# Patient Record
Sex: Female | Born: 1975 | Race: White | Hispanic: No | Smoking: Never smoker
Health system: Southern US, Community
[De-identification: ages and names within clinical notes are randomized; demographics above are authoritative.]

## PROBLEM LIST (undated history)

## (undated) DIAGNOSIS — E282 Polycystic ovarian syndrome: Secondary | ICD-10-CM

## (undated) DIAGNOSIS — R55 Syncope and collapse: Secondary | ICD-10-CM

## (undated) DIAGNOSIS — G2581 Restless legs syndrome: Secondary | ICD-10-CM

## (undated) HISTORY — DX: Polycystic ovarian syndrome: E28.2

## (undated) HISTORY — DX: Syncope and collapse: R55

## (undated) HISTORY — PX: ANKLE SURGERY: SHX546

## (undated) HISTORY — PX: DENTAL SURGERY: SHX609

---

## 2000-02-26 ENCOUNTER — Other Ambulatory Visit: Admission: RE | Admit: 2000-02-26 | Discharge: 2000-02-26 | Payer: Self-pay | Admitting: *Deleted

## 2002-04-30 ENCOUNTER — Emergency Department (HOSPITAL_COMMUNITY): Admission: EM | Admit: 2002-04-30 | Discharge: 2002-04-30 | Payer: Self-pay | Admitting: Emergency Medicine

## 2009-01-30 LAB — CONVERTED CEMR LAB: Pap Smear: NORMAL

## 2009-07-27 ENCOUNTER — Encounter (INDEPENDENT_AMBULATORY_CARE_PROVIDER_SITE_OTHER): Payer: Self-pay | Admitting: *Deleted

## 2009-08-02 ENCOUNTER — Telehealth: Payer: Self-pay | Admitting: Family Medicine

## 2009-08-10 ENCOUNTER — Ambulatory Visit: Payer: Self-pay | Admitting: Family Medicine

## 2009-08-10 DIAGNOSIS — M1990 Unspecified osteoarthritis, unspecified site: Secondary | ICD-10-CM | POA: Insufficient documentation

## 2009-08-10 DIAGNOSIS — M25569 Pain in unspecified knee: Secondary | ICD-10-CM

## 2009-08-16 ENCOUNTER — Ambulatory Visit: Payer: Self-pay | Admitting: Family Medicine

## 2009-08-16 ENCOUNTER — Encounter (INDEPENDENT_AMBULATORY_CARE_PROVIDER_SITE_OTHER): Payer: Self-pay | Admitting: *Deleted

## 2009-08-16 LAB — CONVERTED CEMR LAB
Ketones, urine, test strip: NEGATIVE
Nitrite: NEGATIVE
Urobilinogen, UA: 0.2
WBC Urine, dipstick: NEGATIVE

## 2009-08-20 LAB — CONVERTED CEMR LAB
ALT: 15 units/L (ref 0–35)
Alkaline Phosphatase: 62 units/L (ref 39–117)
Basophils Absolute: 0 10*3/uL (ref 0.0–0.1)
Basophils Relative: 0.7 % (ref 0.0–3.0)
Bilirubin, Direct: 0 mg/dL (ref 0.0–0.3)
CO2: 27 meq/L (ref 19–32)
Chloride: 106 meq/L (ref 96–112)
Eosinophils Absolute: 0.2 10*3/uL (ref 0.0–0.7)
MCHC: 33.2 g/dL (ref 30.0–36.0)
MCV: 88 fL (ref 78.0–100.0)
Monocytes Absolute: 0.4 10*3/uL (ref 0.1–1.0)
Neutro Abs: 3.8 10*3/uL (ref 1.4–7.7)
Neutrophils Relative %: 58.8 % (ref 43.0–77.0)
RBC: 4.59 M/uL (ref 3.87–5.11)
RDW: 12.8 % (ref 11.5–14.6)
Sodium: 139 meq/L (ref 135–145)
Total CHOL/HDL Ratio: 2
Total Protein: 7.7 g/dL (ref 6.0–8.3)
Triglycerides: 59 mg/dL (ref 0.0–149.0)

## 2009-09-24 ENCOUNTER — Ambulatory Visit: Payer: Self-pay | Admitting: Family

## 2009-09-24 DIAGNOSIS — R1013 Epigastric pain: Secondary | ICD-10-CM

## 2009-09-26 ENCOUNTER — Encounter: Payer: Self-pay | Admitting: Family

## 2009-09-26 ENCOUNTER — Telehealth (INDEPENDENT_AMBULATORY_CARE_PROVIDER_SITE_OTHER): Payer: Self-pay | Admitting: *Deleted

## 2009-09-26 LAB — CONVERTED CEMR LAB
ALT: 18 units/L (ref 0–35)
AST: 19 units/L (ref 0–37)
Alkaline Phosphatase: 67 units/L (ref 39–117)
Indirect Bilirubin: 0.2 mg/dL (ref 0.0–0.9)
Total Protein: 7.7 g/dL (ref 6.0–8.3)

## 2009-09-27 ENCOUNTER — Encounter: Payer: Self-pay | Admitting: Family

## 2009-10-12 ENCOUNTER — Encounter: Payer: Self-pay | Admitting: Family Medicine

## 2010-08-20 NOTE — Letter (Signed)
Summary: Records Dated 06-21-01 thru 05-24-09/[facility]  Records Dated 06-21-01 thru 05-24-09/[facility]   Imported By: Lanelle Bal 08/22/2009 12:22:50  _____________________________________________________________________  External Attachment:    Type:   Image     Comment:   External Document

## 2010-08-20 NOTE — Letter (Signed)
Summary: Patient Does Not Want Appt/[facility]  Patient Does Not Want Appt/[facility]   Imported By: Lanelle Bal 10/17/2009 12:26:49  _____________________________________________________________________  External Attachment:    Type:   Image     Comment:   External Document

## 2010-08-20 NOTE — Progress Notes (Signed)
Summary: Rx for Vaccination   Phone Note Call from Patient Call back at [phone]   Caller: Patient Summary of Call: Pt has an appt on Jan 21st, she is going out of the [place] and needs vaccinations. She went to the health dept to get some. They told her they would give her the oral dose of the Typhoid but would need an rx for this. She will need to get this done before she comes in. Is this something you will do? Please advise. Army Fossa CMA  August 02, 2009 8:40 AM  Follow-up for Phone Call        Vivotif 1 cap every other day x4 doses --- #4 --- take >1 week before exposure-----repeat in 5 years Follow-up by: Loreen Freud DO,  August 02, 2009 9:54 AM  Additional Follow-up for Phone Call Additional follow up Details #1::        pt is aware- sent to pharm. Army Fossa CMA  August 02, 2009 10:11 AM    New/Updated Medications: VIVOTIF BERNA VACCINE  CPDR (TYPHOID VACCINE) 1 by mouth every other day x 4 doses. Take >1 week before exposure. Repeat in 5 years. Prescriptions: VIVOTIF BERNA VACCINE  CPDR (TYPHOID VACCINE) 1 by mouth every other day x 4 doses. Take >1 week before exposure. Repeat in 5 years.  #4 x 0   Entered by:   Army Fossa CMA   Authorized by:   Loreen Freud DO   Signed by:   Army Fossa CMA on 08/02/2009   Method used:   Electronically to        [facility] (retail)       [address]       [place], [place]  [address]       Ph: [phone]       Fax: [phone]   RxID:   [id number]

## 2010-08-20 NOTE — Letter (Signed)
Summary: New Patient Letter  [facility] [place]  [address] [place], [place] [address]   Phone: [phone]  Fax: [phone]       07/27/2009 MRN: [id number]  HAZELENE DOTEN [address] [place], [place]  [address]  Dear Ms. Ilean Skill,   Welcome to [facility] and thank you for choosing [place] as your Primary Care Providers. Enclosed you will find information about our practice that we hope you find helpful. We have also enclosed forms to be filled out prior to your visit. This will provide [place] with the necessary information and facilitate your being seen in a timely manner. If you have any questions, please call us at: [phone] and we will be happy to assist you. We look forward to seeing you at your scheduled appointment time.  Appointment: Friday, August 10, 2009 at 10:00am   With: Dr. Loreen Freud  (NOTHING TO EAT OR DRINK AFTER MIDNIGHT ON 08-09-2009)     Sincerely,  Primary Health Care Team  Please arrive 15 minutes early for your first appointment and bring your insurance card. Co-pay is required at the time of your visit.  *****Please call the office if you are not able to keep this appointment. There is a charge of $50.00 if any appointment is not cancelled or rescheduled within 24 hours*****

## 2010-08-20 NOTE — Assessment & Plan Note (Signed)
Summary: NEW TO EST,WANTS CPX,UHC INS/RH......   Vital Signs:  Patient profile:   35 year old female Height:      71.5 inches Weight:      305 pounds BMI:     42.10 Temp:     98.2 degrees F oral Pulse rate:   82 / minute Pulse rhythm:   regular BP sitting:   124 / 84  (left arm) Cuff size:   large  Vitals Entered By: Army Fossa CMA (August 10, 2009 10:08 AM) CC: new to establish- needs vaccines (traveling on trip) hep a/b and boostrix.   History of Present Illness: Pt here to establish and get vaccines for [place].  Pt is leaving in 2 weeks.     Pt c/o R knee pain ----  Pt states pain started after standing on it for 15 hours and walking the next day.  Pt saw ortho in [place] but was not happy there.  Pain is getting worse.    Pt sees Dr Edward Jolly for gyn.  Preventive Screening-Counseling & Management  Alcohol-Tobacco     Alcohol drinks/day: <1     Smoking Status: never  Caffeine-Diet-Exercise     Caffeine use/day: 1     Does Patient Exercise: yes     Type of exercise: walking      Exercise (avg: min/session): 30-60     Times/week: <3  Hep-HIV-STD-Contraception     Dental Visit-last 6 months yes     Dental Care Counseling: not indicated; dental care within six months      Sexual History:  single --not sexually active.    Current Medications (verified): 1)  Vivotif Berna Vaccine  Cpdr (Typhoid Vaccine) .Marland Kitchen.. 1 By Mouth Every Other Day X 4 Doses. Take >1 Week Before Exposure. Repeat in 5 Years. 2)  Nabumetone 750 Mg Tabs (Nabumetone) .Marland Kitchen.. 1 By Mouth Two Times A Day.  Allergies (verified): 1)  ! Augmentin  Past History:  Family History: Last updated: 08/10/2009 Family History of Arthritis Family History Diabetes 1st degree relative Family History of Stroke F 1st degree relative <60  Social History: Last updated: 08/10/2009 Occupation:  Risk analyst, photographer Single Never Smoked Alcohol use-yes Regular exercise-yes  Risk Factors: Alcohol Use: <1  (08/10/2009) Caffeine Use: 1 (08/10/2009) Exercise: yes (08/10/2009)  Risk Factors: Smoking Status: never (08/10/2009)  Past Medical History: Osteoarthritis-- R Knee  Past Surgical History: Ankle Repair 1998  Family History: Reviewed history and no changes required. Family History of Arthritis Family History Diabetes 1st degree relative Family History of Stroke F 1st degree relative <60  Social History: Reviewed history and no changes required. Occupation:  Risk analyst, Environmental manager Single Never Smoked Alcohol use-yes Regular exercise-yes Occupation:  employed Smoking Status:  never Does Patient Exercise:  yes Caffeine use/day:  1 Dental Care w/in 6 mos.:  yes Sexual History:  single --not sexually active  Review of Systems      See HPI General:  Denies chills, fatigue, fever, loss of appetite, malaise, sleep disorder, sweats, weakness, and weight loss. Eyes:  Denies blurring, discharge, double vision, eye irritation, eye pain, halos, itching, light sensitivity, red eye, vision loss-1 eye, and vision loss-both eyes; optho--q2y. ENT:  Denies decreased hearing, difficulty swallowing, ear discharge, earache, hoarseness, nasal congestion, nosebleeds, postnasal drainage, ringing in ears, sinus pressure, and sore throat. CV:  Denies bluish discoloration of lips or nails, chest pain or discomfort, difficulty breathing at night, difficulty breathing while lying down, fainting, fatigue, leg cramps with exertion, lightheadness, near fainting, palpitations,  shortness of breath with exertion, swelling of feet, swelling of hands, and weight gain. Resp:  Denies chest discomfort, chest pain with inspiration, cough, coughing up blood, excessive snoring, hypersomnolence, morning headaches, pleuritic, shortness of breath, sputum productive, and wheezing. GI:  Denies abdominal pain, bloody stools, change in bowel habits, constipation, dark tarry stools, diarrhea, excessive appetite, gas,  hemorrhoids, indigestion, loss of appetite, and nausea. GU:  Denies abnormal vaginal bleeding, decreased libido, discharge, dysuria, genital sores, hematuria, incontinence, nocturia, urinary frequency, and urinary hesitancy. MS:  Complains of joint pain; denies joint redness, joint swelling, loss of strength, low back pain, mid back pain, muscle aches, muscle , cramps, muscle weakness, stiffness, and thoracic pain; R knee pain. Derm:  Denies changes in color of skin, changes in nail beds, dryness, excessive perspiration, flushing, hair loss, insect bite(s), itching, lesion(s), poor wound healing, and rash. Neuro:  Denies brief paralysis, difficulty with concentration, disturbances in coordination, falling down, headaches, inability to speak, memory loss, numbness, poor balance, seizures, sensation of room spinning, tingling, tremors, visual disturbances, and weakness. Psych:  Denies alternate hallucination ( auditory/visual), anxiety, depression, easily angered, easily tearful, irritability, mental problems, panic attacks, sense of great danger, suicidal thoughts/plans, thoughts of violence, unusual visions or sounds, and thoughts /plans of harming others. Endo:  Denies cold intolerance, excessive hunger, excessive thirst, excessive urination, heat intolerance, polyuria, and weight change. Heme:  Denies abnormal bruising, bleeding, enlarge lymph nodes, fevers, pallor, and skin discoloration.  Physical Exam  General:  Well-developed,well-nourished,in no acute distress; alert,appropriate and cooperative throughout examination Head:  Normocephalic and atraumatic without obvious abnormalities. No apparent alopecia or balding. Eyes:  pupils equal, pupils round, pupils reactive to light, and no injection.   Ears:  External ear exam shows no significant lesions or deformities.  Otoscopic examination reveals clear canals, tympanic membranes are intact bilaterally without bulging, retraction, inflammation or  discharge. Hearing is grossly normal bilaterally. Nose:  External nasal examination shows no deformity or inflammation. Nasal mucosa are pink and moist without lesions or exudates. Mouth:  Oral mucosa and oropharynx without lesions or exudates.  Teeth in good repair. Neck:  No deformities, masses, or tenderness noted. Breasts:  gyn Lungs:  Normal respiratory effort, chest expands symmetrically. Lungs are clear to auscultation, no crackles or wheezes. Heart:  Normal rate and regular rhythm. S1 and S2 normal without gallop, murmur, click, rub or other extra sounds. Abdomen:  Bowel sounds positive,abdomen soft and non-tender without masses, organomegaly or hernias noted. Genitalia:  gyn Msk:  R knee pain with weight bearing  no pain with palpationno joint warmth and no redness over joints.   Pulses:  R posterior tibial normal, R dorsalis pedis normal, R carotid normal, L posterior tibial normal, L dorsalis pedis normal, and L carotid normal.   Extremities:  No clubbing, cyanosis, edema, or deformity noted with normal full range of motion of all joints.   Neurologic:  No cranial nerve deficits noted. Station and gait are normal. Plantar reflexes are down-going bilaterally. DTRs are symmetrical throughout. Sensory, motor and coordinative functions appear intact. Skin:  small dry patch on abd no other lesions Cervical Nodes:  No lymphadenopathy noted Psych:  Cognition and judgment appear intact. Alert and cooperative with normal attention span and concentration. No apparent delusions, illusions, hallucinations   Impression & Recommendations:  Problem # 1:  PREVENTIVE HEALTH CARE (ICD-V70.0) check fasting labs pap per gyn  ghm utd  Problem # 2:  KNEE PAIN, RIGHT (ICD-719.46)  Her updated medication list for this problem  includes:    Nabumetone 750 Mg Tabs (Nabumetone) .Marland Kitchen... 1 by mouth two times a day.  Orders: Orthopedic Surgeon Referral (Ortho Surgeon)  Discussed strengthening exercises,  use of ice or heat, and medications.   Problem # 3:  OSTEOARTHRITIS (ICD-715.90)  Her updated medication list for this problem includes:    Nabumetone 750 Mg Tabs (Nabumetone) .Marland Kitchen... 1 by mouth two times a day.  Orders: Orthopedic Surgeon Referral (Ortho Surgeon)  Discussed use of medications, application of heat or cold, and exercises.   Complete Medication List: 1)  Vivotif Berna Vaccine Cpdr (Typhoid vaccine) .Marland Kitchen.. 1 by mouth every other day x 4 doses. take >1 week before exposure. repeat in 5 years. 2)  Nabumetone 750 Mg Tabs (Nabumetone) .Marland Kitchen.. 1 by mouth two times a day.  Other Orders: TwinRix 1ml ( Hep A&B Adult dose) (13086) Admin 1st Vaccine (57846) Tdap => 22yrs IM (96295) Admin of Any Addtl Vaccine (28413)  Patient Instructions: 1)  V70   cbcd, lipid , bmp, hep, tsh,  UA---fasting labs   Immunizations Administered:  TwinRix # 1:    Vaccine Type: TwinRix    Site: left deltoid    Mfr: GlaxoSmithKline    Dose: 0.1 ml    Route: IM    Given by: Army Fossa CMA    Exp. Date: 06/21/2010    Lot #: [id number]  Tetanus Vaccine:    Vaccine Type: Tdap    Site: right deltoid    Mfr: GlaxoSmithKline    Dose: 0.5 ml    Route: IM    Given by: Army Fossa CMA    Exp. Date: 09/15/2011    Lot #: [id number]   Flu Vaccine Next Due:  Refused PAP Result Date:  01/30/2009 PAP Result:  normal PAP Next Due:  1 yr   Immunizations Administered:  TwinRix # 1:    Vaccine Type: TwinRix    Site: left deltoid    Mfr: GlaxoSmithKline    Dose: 0.1 ml    Route: IM    Given by: Army Fossa CMA    Exp. Date: 06/21/2010    Lot #: [id number]  Tetanus Vaccine:    Vaccine Type: Tdap    Site: right deltoid    Mfr: GlaxoSmithKline    Dose: 0.5 ml    Route: IM    Given by: Army Fossa CMA    Exp. Date: 09/15/2011    Lot #: [id number]

## 2010-08-20 NOTE — Letter (Signed)
   [place] [facility] [address] [place], [place]  [address]  [place] Phone: [phone]      September 27, 2009   [place] [address] [place], [place] [address]  RE:  LAB RESULTS  Dear  Ms. BOWRING,  The following is an interpretation of your most recent lab tests.  Please take note of any instructions provided or changes to medications that have resulted from your lab work.  KIDNEY FUNCTION TESTS:  Good - no changes needed  LIVER FUNCTION TESTS:  Good - no changes needed   Pancreatic enzymes are normal.  Hope you are feeling better!   Sincerely Yours,    Lemont Fillers FNP

## 2010-08-20 NOTE — Letter (Signed)
Summary: Generic Letter  [place] [facility] [place]  [address] [place], [place] [address]   Phone: [phone]  Fax: [phone]    08/16/2009  Czarina Wentzell [address] [place], [place]  [address]  To Whom It May Concern:  The above patient does have a knee injury and is unable to work out at this time. Please let her out of her current contract.            Sincerely,   Loreen Freud, DO

## 2010-08-20 NOTE — Progress Notes (Signed)
----   Converted from flag ---- ---- 09/26/2009 11:54 AM, Lemont Fillers FNP wrote: Yes pls add  ---- 09/26/2009 11:27 AM, Mervin Kung CMA wrote: Per Jamesetta So at [facility]. The Hepatic panel was marked off on the printed order but they can still add it if you want them to?  ---- 09/25/2009 4:06 PM, Lemont Fillers FNP wrote: could you pls call [facility] and see if hepatic panel was drawn and if not pls have them add on?  This was ordered yesterday.  Thanks ------------------------------       Additional Follow-up for Phone Call Additional follow up Details #2::    09/26/09 Spoke to [place] @ [facility] @ 12pm and added Hepatic function panel--789.06 per Melissa.  Tf,cma Follow-up by: Mervin Kung CMA,  September 26, 2009 12:02 PM

## 2010-08-20 NOTE — Assessment & Plan Note (Signed)
Summary: stomach pains x2 days//just got back from [place]//[facility]   Vital Signs:  Patient profile:   35 year old female Weight:      299.25 pounds BMI:     41.30 Temp:     97.4 degrees F oral Pulse rate:   76 / minute Pulse rhythm:   regular Resp:     16 per minute BP sitting:   120 / 96  (right arm) Cuff size:   large  Vitals Entered By: Mervin Kung CMA (September 24, 2009 3:42 PM) CC: room 5  Stomach pains x 1 1/2 weeks, now having nausea. Comments Pt has recently returned from trip to [place].   Primary Care Provider:  Laury Rodriguez  CC:  room 5  Stomach pains x 1 1/2 weeks and now having nausea..  History of Present Illness: Misty Rodriguez is a 35 year old female who presents with c/o of abdominal pain.  Patient notes + abdominal pain x 10 days.  Recently returned from a mission trip to [place].  Denies fever.  Abdominal pain is localized to the epigastric area.   + nausea, no vomitting.  Denies diarrhea.  Denies dark or bloody stools.  Pain is intermittent in nature- + pain after eating, but notes occasional pain between meals as well.  Notes no alleviating factors.  Notes that some of her trip mates had nausea and vomitting.  Notes that she took cipro empirically one tablet by mouth daily x 6 days.  Also of note patient has been taking prophylactic doxycycline for malaria prevention.  Notes that she was taking this medicine 2 weeks prior to developing any abdominal pain.    Allergies: 1)  ! Augmentin  Physical Exam  General:  morbidly obese white female, in NAD Lungs:  Normal respiratory effort, chest expands symmetrically. Lungs are clear to auscultation, no crackles or wheezes. Heart:  Normal rate and regular rhythm. S1 and S2 normal without gallop, murmur, click, rub or other extra sounds. Abdomen:  Bowel sounds positive,abdomen soft and non-tender without masses, organomegaly or hernias noted.   Impression & Recommendations:  Problem # 1:  EPIGASTRIC PAIN (ICD-789.06) Assessment  New Will add empiric PPI, will also check LFT's, amylase, lipase to r/o GB disease or pancreatitis.   In addition, will d/c doxy and change patient to once weekly mefloquine to complete the malaria prophylaxis.  It maybe that patient's GI upset is due to doxycycline.  plan f/u in 2 weeks, sooner if symptoms worsen Orders: T-Hepatic Function [id number]) T-Amylase [phone]) T-Lipase ([phone])  Complete Medication List: 1)  Vivotif Berna Vaccine Cpdr (Typhoid vaccine) .Marland Kitchen.. 1 by mouth every other day x 4 doses. take >1 week before exposure. repeat in 5 years. 2)  Nabumetone 750 Mg Tabs (Nabumetone) .Marland Kitchen.. 1 by mouth two times a day. 3)  Mefloquine Hcl 250 Mg Tabs (Mefloquine hcl) .... One tablet by mouth once weekly x 4 weeks 4)  Prilosec 20 Mg Cpdr (Omeprazole) .... One tablet by mouth daily  Patient Instructions: 1)  Please call if worsening abdominal pain, diarrhea, fever, or inability to keep down food,drink/medicines. 2)  Please schedule a follow-up appointment in 2 weeks. Prescriptions: MEFLOQUINE HCL 250 MG TABS (MEFLOQUINE HCL) one tablet by mouth once weekly x 4 weeks  #4 x 0   Entered and Authorized by:   Lemont Fillers FNP   Signed by:   Lemont Fillers FNP on 09/24/2009   Method used:   Electronically to  [facility] [address] [address] (retail)       [address]       [place], [place]  [address]       Ph: [phone]       Fax: [phone]   RxID:   [id number]   Current Allergies (reviewed today): ! AUGMENTIN

## 2011-08-14 ENCOUNTER — Encounter: Payer: Self-pay | Admitting: Family Medicine

## 2011-08-14 ENCOUNTER — Other Ambulatory Visit: Payer: Self-pay | Admitting: Family Medicine

## 2011-08-14 ENCOUNTER — Ambulatory Visit (INDEPENDENT_AMBULATORY_CARE_PROVIDER_SITE_OTHER): Payer: 59 | Admitting: Family Medicine

## 2011-08-14 VITALS — BP 122/85 | HR 76 | Temp 98.9°F | Ht 71.0 in | Wt 300.8 lb

## 2011-08-14 DIAGNOSIS — J029 Acute pharyngitis, unspecified: Secondary | ICD-10-CM

## 2011-08-14 DIAGNOSIS — R05 Cough: Secondary | ICD-10-CM

## 2011-08-14 LAB — POCT RAPID STREP A (OFFICE): Rapid Strep A Screen: NEGATIVE

## 2011-08-14 MED ORDER — HYDROCODONE-HOMATROPINE 5-1.5 MG/5ML PO SYRP: 5.0000 mL | ORAL_SOLUTION | Freq: Three times a day (TID) | ORAL | Status: AC | PRN

## 2011-08-14 MED ORDER — CLARITHROMYCIN ER 500 MG PO TB24: 1000.0000 mg | ORAL_TABLET | Freq: Every day | ORAL | Status: AC

## 2011-08-14 NOTE — Progress Notes (Signed)
  Subjective:     Misty Rodriguez is a 36 y.o. female who presents for evaluation of sore throat. Associated symptoms include nasal blockage, post nasal drip, sinus and nasal congestion and sore throat. Onset of symptoms was 2 days ago, and have been gradually worsening since that time. She is drinking plenty of fluids. She has had a recent close exposure to someone with proven streptococcal pharyngitis.  The following portions of the patient's history were reviewed and updated as appropriate: allergies, current medications, past family history, past medical history, past social history, past surgical history and problem list.  Review of Systems Pertinent items are noted in HPI.    Objective:    BP 122/85  Pulse 76  Temp(Src) 98.9 F (37.2 C) (Oral)  Ht 5\' 11"  (1.803 m)  Wt 300 lb 12.8 oz (136.442 kg)  BMI 41.95 kg/m2  SpO2 97%  LMP 08/07/2011 General appearance: alert, cooperative, appears stated age and no distress Ears: normal TM's and external ear canals both ears Nose: green discharge, mild congestion, turbinates red, swollen, edematous, sinus tenderness bilateral Throat: abnormal findings: marked oropharyngeal erythema and tonsillar hypertrophy 2+ Neck: mild anterior cervical adenopathy, supple, symmetrical, trachea midline and thyroid not enlarged, symmetric, no tenderness/mass/nodules Lungs: clear to auscultation bilaterally Heart: S1, S2 normal  Laboratory Strep test done. Results:negative.    Assessment:    Acute pharyngitis, likely  Sinusitis with post nasal drip.    Plan:    Patient placed on antibiotics. Use of OTC analgesics recommended as well as salt water gargles. Follow up as needed.

## 2011-08-14 NOTE — Patient Instructions (Signed)

## 2011-08-16 LAB — CULTURE, GROUP A STREP

## 2011-08-25 ENCOUNTER — Telehealth: Payer: Self-pay | Admitting: *Deleted

## 2011-08-25 NOTE — Telephone Encounter (Signed)
Its been more than 2 weeks---pt needs ov

## 2011-08-25 NOTE — Telephone Encounter (Signed)
Pt states that she took antibiotic and sore throat resolve than 4 days after completing med sore throat return. Pt denies any fever,or difficulty with swallowing. Pt now c/o sore throat again. Pt  throat culture was normal. Please advise   Pt uses [facility], Pt aware Dr Laury Axon out of office today will advise on issue tomorrow.

## 2011-08-26 NOTE — Telephone Encounter (Signed)
Discussed with patient and she stated she wanted to wait a few more days before she scheduled an apt because she felt better today.     KP

## 2011-09-19 ENCOUNTER — Other Ambulatory Visit (HOSPITAL_COMMUNITY)
Admission: RE | Admit: 2011-09-19 | Discharge: 2011-09-19 | Disposition: A | Discharge status: Home / Self Care | Payer: 59 | Source: Ambulatory Visit | Attending: Family Medicine | Admitting: Family Medicine

## 2011-09-19 ENCOUNTER — Ambulatory Visit (INDEPENDENT_AMBULATORY_CARE_PROVIDER_SITE_OTHER): Payer: 59 | Admitting: Family Medicine

## 2011-09-19 ENCOUNTER — Encounter: Payer: Self-pay | Admitting: Family Medicine

## 2011-09-19 VITALS — BP 116/80 | HR 86 | Temp 97.5°F | Ht 71.5 in | Wt 304.2 lb

## 2011-09-19 DIAGNOSIS — Z01419 Encounter for gynecological examination (general) (routine) without abnormal findings: Secondary | ICD-10-CM | POA: Insufficient documentation

## 2011-09-19 DIAGNOSIS — Z0000 Encounter for general adult medical examination without abnormal findings: Secondary | ICD-10-CM

## 2011-09-19 DIAGNOSIS — R319 Hematuria, unspecified: Secondary | ICD-10-CM

## 2011-09-19 DIAGNOSIS — E282 Polycystic ovarian syndrome: Secondary | ICD-10-CM

## 2011-09-19 LAB — CBC WITH DIFFERENTIAL/PLATELET
Basophils Relative: 0.3 % (ref 0.0–3.0)
Eosinophils Absolute: 0.2 10*3/uL (ref 0.0–0.7)
Hemoglobin: 13.9 g/dL (ref 12.0–15.0)
MCHC: 33.9 g/dL (ref 30.0–36.0)
MCV: 85.1 fl (ref 78.0–100.0)
Monocytes Absolute: 0.4 10*3/uL (ref 0.1–1.0)
Neutro Abs: 4.8 10*3/uL (ref 1.4–7.7)
Neutrophils Relative %: 66.1 % (ref 43.0–77.0)
RBC: 4.8 Mil/uL (ref 3.87–5.11)
RDW: 14.1 % (ref 11.5–14.6)

## 2011-09-19 LAB — LIPID PANEL
HDL: 56.7 mg/dL (ref >39.00)
Triglycerides: 72 mg/dL (ref 0.0–149.0)

## 2011-09-19 LAB — POCT URINALYSIS DIPSTICK
Bilirubin, UA: NEGATIVE
Ketones, UA: NEGATIVE
Spec Grav, UA: <=1.005
pH, UA: 6.5

## 2011-09-19 LAB — BASIC METABOLIC PANEL
CO2: 25 mEq/L (ref 19–32)
Chloride: 103 mEq/L (ref 96–112)
Creatinine, Ser: 0.7 mg/dL (ref 0.4–1.2)
Glucose, Bld: 93 mg/dL (ref 70–99)

## 2011-09-19 LAB — T4, FREE: Free T4: 0.93 ng/dL (ref 0.60–1.60)

## 2011-09-19 LAB — ESTRADIOL: Estradiol: 226.8 pg/mL

## 2011-09-19 LAB — HEPATIC FUNCTION PANEL
Albumin: 4 g/dL (ref 3.5–5.2)
Total Protein: 7.6 g/dL (ref 6.0–8.3)

## 2011-09-19 NOTE — Progress Notes (Signed)
Addended by: Silvio Pate D on: 09/19/2011 10:39 AM   Modules accepted: Orders

## 2011-09-19 NOTE — Patient Instructions (Signed)

## 2011-09-19 NOTE — Progress Notes (Signed)
Subjective:     Misty Rodriguez is a 36 y.o. female and is here for a comprehensive physical exam. The patient reports no problems.  History   Social History  . Marital Status: Single    Spouse Name: N/A    Number of Children: N/A  . Years of Education: N/A   Occupational History  . 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 Smoking status: Never Smoker   . Smokeless tobacco: Not on file  . Alcohol Use: No  . Drug Use: No  . Sexually Active: No   Other Topics Concern  . Not on file   Social History Narrative   Exercise--swim, pilates and walk--  Something everyday   Health Maintenance  Topic Date Due  . Influenza Vaccine  04/20/2012  . Pap Smear  09/19/2014  . Tetanus/tdap  08/11/2019    The following portions of the patient's history were reviewed and updated as appropriate: allergies, current medications, past family history, past medical history, past social history, past surgical history and problem list.  Review of Systems Review of Systems  Constitutional: Negative for activity change, appetite change and fatigue.  HENT: Negative for hearing loss, congestion, tinnitus and ear discharge.  dentist q75m Eyes: Negative for visual disturbance (see optho --last time 4 yrs ago-- no problems) Respiratory: Negative for cough, chest tightness and shortness of breath.   Cardiovascular: Negative for chest pain, palpitations and leg swelling.  Gastrointestinal: Negative for abdominal pain, diarrhea, constipation and abdominal distention.  Genitourinary: Negative for urgency, frequency, decreased urine volume and difficulty urinating.  Musculoskeletal: Negative for back pain, arthralgias and gait problem.  Skin: Negative for color change, pallor and rash.  Neurological: Negative for dizziness, light-headedness, numbness and headaches.  Hematological: Negative for adenopathy. Does not bruise/bleed easily.  Psychiatric/Behavioral: Negative for suicidal  ideas, confusion, sleep disturbance, self-injury, dysphoric mood, decreased concentration and agitation.       Objective:    BP 116/80  Pulse 86  Temp(Src) 97.5 F (36.4 C) (Oral)  Ht 5' 11.5" (1.816 m)  Wt 304 lb 3.8 oz (138 kg)  BMI 41.84 kg/m2  SpO2 97%  LMP 09/07/2011 General appearance: alert, cooperative, appears stated age and no distress Head: Normocephalic, without obvious abnormality, atraumatic Eyes: conjunctivae/corneas clear. PERRL, EOM's intact. Fundi benign. Ears: normal TM's and external ear canals both ears Nose: Nares normal. Septum midline. Mucosa normal. No drainage or sinus tenderness. Throat: lips, mucosa, and tongue normal; teeth and gums normal Neck: no adenopathy, supple, symmetrical, trachea midline and thyroid not enlarged, symmetric, no tenderness/mass/nodules Back: symmetric, no curvature. ROM normal. No CVA tenderness. Lungs: clear to auscultation bilaterally Breasts: normal appearance, no masses or tenderness Heart: regular rate and rhythm, S1, S2 normal, no murmur, click, rub or gallop Abdomen: soft, non-tender; bowel sounds normal; no masses,  no organomegaly-- obese Pelvic: cervix normal in appearance, external genitalia normal, no adnexal masses or tenderness, no cervical motion tenderness, rectovaginal septum normal, uterus normal size, shape, and consistency and vagina normal without discharge Extremities: extremities normal, atraumatic, no cyanosis or edema Pulses: 2+ and symmetric Skin: Skin color, texture, turgor normal. No rashes or lesions Lymph nodes: Cervical, supraclavicular, and axillary nodes normal. Neurologic: Alert and oriented X 3, normal strength and tone. Normal symmetric reflexes. Normal coordination and gait    Assessment:    Healthy female exam.    hx PCOS-- -check labs at pt request Plan:    check labs ghm utd See After Visit Summary for  Counseling Recommendations

## 2011-09-22 LAB — TESTOSTERONE, FREE, TOTAL, SHBG
Testosterone, Free: 7.4 pg/mL — ABNORMAL HIGH (ref 0.6–6.8)
Testosterone-% Free: 1.4 % (ref 0.4–2.4)
Testosterone: 52.52 ng/dL (ref 10–70)

## 2011-09-23 LAB — URINE CULTURE

## 2012-09-04 ENCOUNTER — Other Ambulatory Visit: Payer: Self-pay

## 2013-05-26 ENCOUNTER — Other Ambulatory Visit: Payer: Self-pay

## 2013-11-09 ENCOUNTER — Telehealth: Payer: Self-pay

## 2013-11-09 NOTE — Telephone Encounter (Signed)
Left message for call back Non-identifiable   Pap- 09/19/11 Flu Td- 08/10/09

## 2013-11-10 ENCOUNTER — Encounter: Payer: Self-pay | Admitting: Family Medicine

## 2013-11-10 ENCOUNTER — Ambulatory Visit (INDEPENDENT_AMBULATORY_CARE_PROVIDER_SITE_OTHER): Payer: PRIVATE HEALTH INSURANCE | Admitting: Family Medicine

## 2013-11-10 VITALS — BP 114/80 | HR 66 | Temp 98.0°F | Ht 71.5 in | Wt 300.0 lb

## 2013-11-10 DIAGNOSIS — M25562 Pain in left knee: Secondary | ICD-10-CM

## 2013-11-10 DIAGNOSIS — M25561 Pain in right knee: Secondary | ICD-10-CM

## 2013-11-10 DIAGNOSIS — L84 Corns and callosities: Secondary | ICD-10-CM

## 2013-11-10 DIAGNOSIS — M25569 Pain in unspecified knee: Secondary | ICD-10-CM

## 2013-11-10 DIAGNOSIS — Z0000 Encounter for general adult medical examination without abnormal findings: Secondary | ICD-10-CM

## 2013-11-10 DIAGNOSIS — E559 Vitamin D deficiency, unspecified: Secondary | ICD-10-CM

## 2013-11-10 MED ORDER — VITAMIN D (ERGOCALCIFEROL) 1.25 MG (50000 UNIT) PO CAPS: 50000.0000 [IU] | ORAL_CAPSULE | ORAL | Status: DC

## 2013-11-10 NOTE — Progress Notes (Signed)
Subjective:     Misty Rodriguez is a 38 y.o. female and is here for a comprehensive physical exam. The patient reports problems - b/l knee pain --she is requesting an orthopedic referral.  she also has labs from work to review and is requesting a note for massage, rolfing for chronic neck and back issues. .  History   Social History  . Marital Status: Single    Spouse Name: N/A    Number of Children: N/A  . Years of Education: N/A   Occupational History  . 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 Smoking status: Never Smoker   . Smokeless tobacco: Not on file  . Alcohol Use: No  . Drug Use: No  . Sexual Activity: No   Other Topics Concern  . Not on file   Social History Narrative   Exercise--swim, pilates and walk--  Something everyday   Health Maintenance  Topic Date Due  . Influenza Vaccine  02/18/2014  . Pap Smear  09/19/2014  . Tetanus/tdap  08/11/2019    The following portions of the patient's history were reviewed and updated as appropriate:  She  has a past medical history of PCOS (polycystic ovarian syndrome). She  does not have any pertinent problems on file. She  has past surgical history that includes Ankle surgery and Dental surgery. Her family history includes Aneurysm in her father and paternal grandfather; Diabetes in her paternal grandfather; Heart disease in her maternal grandfather; Hypertension in her maternal grandmother; Stroke in her maternal grandmother. She  reports that she has never smoked. She does not have any smokeless tobacco history on file. She reports that she does not drink alcohol or use illicit drugs. She has a current medication list which includes the following prescription(s): vitamin d (ergocalciferol). No current outpatient prescriptions on file prior to visit.   No current facility-administered medications on file prior to visit.   She is allergic to amoxicillin-pot clavulanate..  Review of  Systems Review of Systems  Constitutional: Negative for activity change, appetite change and fatigue.  HENT: Negative for hearing loss, congestion, tinnitus and ear discharge.  dentist q645m Eyes: Negative for visual disturbance (see optho--- due) Respiratory: Negative for cough, chest tightness and shortness of breath.   Cardiovascular: Negative for chest pain, palpitations and leg swelling.  Gastrointestinal: Negative for abdominal pain, diarrhea, constipation and abdominal distention.  Genitourinary: Negative for urgency, frequency, decreased urine volume and difficulty urinating.  Musculoskeletal: Negative for back pain, arthralgias and gait problem.  Skin: Negative for color change, pallor and rash.  Neurological: Negative for dizziness, light-headedness, numbness and headaches.  Hematological: Negative for adenopathy. Does not bruise/bleed easily.  Psychiatric/Behavioral: Negative for suicidal ideas, confusion, sleep disturbance, self-injury, dysphoric mood, decreased concentration and agitation.       Objective:    BP 114/80  Pulse 66  Temp(Src) 98 F (36.7 C) (Oral)  Ht 5' 11.5" (1.816 m)  Wt 300 lb (136.079 kg)  BMI 41.26 kg/m2  SpO2 98%  LMP 11/07/2013 General appearance: alert, cooperative, appears stated age and no distress Head: Normocephalic, without obvious abnormality, atraumatic Eyes: conjunctivae/corneas clear. PERRL, EOM's intact. Fundi benign. Ears: normal TM's and external ear canals both ears Nose: Nares normal. Septum midline. Mucosa normal. No drainage or sinus tenderness. Throat: lips, mucosa, and tongue normal; teeth and gums normal Neck: no adenopathy, no carotid bruit, no JVD, supple, symmetrical, trachea midline and thyroid not enlarged, symmetric, no tenderness/mass/nodules Back: symmetric, no curvature. ROM normal.  No CVA tenderness. Lungs: clear to auscultation bilaterally Breasts: normal appearance, no masses or tenderness Heart: S1, S2  normal Abdomen: soft, non-tender; bowel sounds normal; no masses,  no organomegaly Pelvic: deferred--- pt on period Extremities: extremities normal, atraumatic, no cyanosis or edema--- corns on both feet 5th toe Pulses: 2+ and symmetric Skin: Skin color, texture, turgor normal. No rashes or lesions Lymph nodes: Cervical, supraclavicular, and axillary nodes normal. Neurologic: Alert and oriented X 3, normal strength and tone. Normal symmetric reflexes. Normal coordination and gait Psych--no depression,no anxiety      Assessment:    Healthy female exam.      Plan:    see labs from work ghm utd See After Visit Summary for Counseling Recommendations   1. Knee pain, bilateral  - Ambulatory referral to Orthopedic Surgery  2. Unspecified vitamin D deficiency  - Vitamin D, Ergocalciferol, (DRISDOL) 50000 UNITS CAPS capsule; Take 1 capsule (50,000 Units total) by mouth every 7 (seven) days.  Dispense: 4 capsule; Refill: 11  3. Preventative health care   4. Corns  - Ambulatory referral to Podiatry

## 2013-11-10 NOTE — Patient Instructions (Signed)

## 2013-11-10 NOTE — Progress Notes (Signed)
Pre visit review using our clinic review tool, if applicable. No additional management support is needed unless otherwise documented below in the visit note. 

## 2013-11-14 NOTE — Telephone Encounter (Signed)
Unable to reach pre visit.  

## 2013-11-23 ENCOUNTER — Encounter: Payer: Self-pay | Admitting: Podiatry

## 2013-11-23 ENCOUNTER — Ambulatory Visit (INDEPENDENT_AMBULATORY_CARE_PROVIDER_SITE_OTHER): Payer: PRIVATE HEALTH INSURANCE | Admitting: Podiatry

## 2013-11-23 VITALS — BP 134/94 | HR 60 | Ht 71.5 in | Wt 300.0 lb

## 2013-11-23 DIAGNOSIS — M216X9 Other acquired deformities of unspecified foot: Secondary | ICD-10-CM | POA: Insufficient documentation

## 2013-11-23 DIAGNOSIS — M2040 Other hammer toe(s) (acquired), unspecified foot: Secondary | ICD-10-CM | POA: Insufficient documentation

## 2013-11-23 DIAGNOSIS — M21969 Unspecified acquired deformity of unspecified lower leg: Secondary | ICD-10-CM | POA: Insufficient documentation

## 2013-11-23 NOTE — Patient Instructions (Signed)
Seen for painful corns on 5th bilateral. Noted of contracted digits 5th bilateral, tight Achilles tendon right, short first metatarsal on both feet. Reviewed all available options. May benefit from stretch exercise for right leg. Return to discuss for further treatment options.

## 2013-11-23 NOTE — Progress Notes (Signed)
Subjective: 38 year old female presents complaining of painful corn on 5th digits bilateral. Also callus under left foot mid lateral surface. She trims the corns but they come back soon. She is here to discuss all available treatment options for the painful corns.  She is also suffering from right knee pain.   Review of Systems - General ROS: negative for - chills, fatigue, fever, night sweats, weight loss or Does have problem with sleep, possible due to restless leg.  Ophthalmic ROS: negative ENT ROS: negative Breast ROS: negative for breast lumps Respiratory ROS: no cough, shortness of breath, or wheezing Cardiovascular ROS: no chest pain or dyspnea on exertion Gastrointestinal ROS: no abdominal pain, change in bowel habits, or black or bloody stools Genito-Urinary ROS: no dysuria, trouble voiding, or hematuria Musculoskeletal ROS: negative Neurological ROS: no TIA or stroke symptoms Dermatological ROS: negative.   Objective: Dermatologic: Digital corn 5th digits at DIPJ bilateral. Light broad callus under ball of both feet. Vascular: All pedal pulses are palpable.  Orthopedic: Contracted 5th digit bilateral, short and hypermobile first ray bilateral, increased forefoot varus with STJ hyperpronation.  Tight Achilles tendon on right lower limb.    Radiographic examination reveal short first metatarsal length, right (-3), left (-4).  Positive of elevated first ray bilateral.  Reveal no acute osseous changes, no significant change in rearfoot joint, Subtalar joint or mid foot area.  Noted of severely contracted lesser digits.   Assessment: 1. 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 2. Short first ray with hypermobility bilateral. 3. Forefoot varus bilateral with Subtalar joint hyperpronation. 4. Ankle equinus right.   Plan: Reviewed clinical findings and available treatment options; periodic debridement, custom orthotics, change in shoe gear or activities, and also  surgical options.  Patient wishes to return for further discussion on treatment options.

## 2014-04-20 ENCOUNTER — Encounter: Payer: Self-pay | Admitting: Family Medicine

## 2014-04-20 ENCOUNTER — Ambulatory Visit (INDEPENDENT_AMBULATORY_CARE_PROVIDER_SITE_OTHER): Payer: Commercial Managed Care - PPO | Admitting: Family Medicine

## 2014-04-20 VITALS — BP 116/80 | HR 76 | Temp 98.7°F | Wt 320.1 lb

## 2014-04-20 DIAGNOSIS — G4700 Insomnia, unspecified: Secondary | ICD-10-CM

## 2014-04-20 DIAGNOSIS — R0683 Snoring: Secondary | ICD-10-CM

## 2014-04-20 DIAGNOSIS — G2581 Restless legs syndrome: Secondary | ICD-10-CM

## 2014-04-20 LAB — HEPATIC FUNCTION PANEL
ALT: 20 U/L (ref 0–35)
AST: 20 U/L (ref 0–37)
Albumin: 4 g/dL (ref 3.5–5.2)
Alkaline Phosphatase: 48 U/L (ref 39–117)
BILIRUBIN TOTAL: 0.2 mg/dL (ref 0.2–1.2)
Bilirubin, Direct: 0 mg/dL (ref 0.0–0.3)
Total Protein: 8.1 g/dL (ref 6.0–8.3)

## 2014-04-20 LAB — IBC PANEL
Iron: 69 ug/dL (ref 42–145)
SATURATION RATIOS: 20.1 % (ref 20.0–50.0)
TRANSFERRIN: 244.7 mg/dL (ref 212.0–360.0)

## 2014-04-20 LAB — TSH: TSH: 1.97 u[IU]/mL (ref 0.35–4.50)

## 2014-04-20 LAB — CBC WITH DIFFERENTIAL/PLATELET
BASOS PCT: 0.3 % (ref 0.0–3.0)
Basophils Absolute: 0 10*3/uL (ref 0.0–0.1)
EOS PCT: 4.3 % (ref 0.0–5.0)
Eosinophils Absolute: 0.3 10*3/uL (ref 0.0–0.7)
HCT: 40.1 % (ref 36.0–46.0)
Hemoglobin: 13.8 g/dL (ref 12.0–15.0)
LYMPHS PCT: 27 % (ref 12.0–46.0)
Lymphs Abs: 1.8 10*3/uL (ref 0.7–4.0)
MCHC: 34.4 g/dL (ref 30.0–36.0)
MCV: 82.7 fl (ref 78.0–100.0)
Monocytes Absolute: 0.5 10*3/uL (ref 0.1–1.0)
Monocytes Relative: 8.2 % (ref 3.0–12.0)
NEUTROS PCT: 60.2 % (ref 43.0–77.0)
Neutro Abs: 4 10*3/uL (ref 1.4–7.7)
Platelets: 273 10*3/uL (ref 150.0–400.0)
RBC: 4.85 Mil/uL (ref 3.87–5.11)
RDW: 13.9 % (ref 11.5–15.5)
WBC: 6.7 10*3/uL (ref 4.0–10.5)

## 2014-04-20 LAB — BASIC METABOLIC PANEL
BUN: 11 mg/dL (ref 6–23)
CO2: 24 mEq/L (ref 19–32)
Calcium: 9.1 mg/dL (ref 8.4–10.5)
Chloride: 105 mEq/L (ref 96–112)
Creatinine, Ser: 0.7 mg/dL (ref 0.4–1.2)
GFR: 98.86 mL/min (ref >60.00)
Glucose, Bld: 91 mg/dL (ref 70–99)
Potassium: 4.2 mEq/L (ref 3.5–5.1)
SODIUM: 135 meq/L (ref 135–145)

## 2014-04-20 LAB — T3, FREE: T3 FREE: 3.1 pg/mL (ref 2.3–4.2)

## 2014-04-20 LAB — FERRITIN: FERRITIN: 23.9 ng/mL (ref 10.0–291.0)

## 2014-04-20 LAB — T4, FREE: Free T4: 0.83 ng/dL (ref 0.60–1.60)

## 2014-04-20 MED ORDER — PRAMIPEXOLE DIHYDROCHLORIDE 0.125 MG PO TABS: ORAL_TABLET | ORAL | Status: DC

## 2014-04-20 NOTE — Progress Notes (Signed)
   Subjective:    Patient ID: Misty Rodriguez, female    DOB: 04/14/1976, 38 y.o.   MRN: [id number]  HPI Pt here c/o insomnia for years.  Pt admits to snoring but states she has several episodes of tonsilitis a year.  The ENT told her they would not take out her tonsils because it was viral.  She also feels like she has rls.  Pt states she has never been told she stops breathing.     Review of Systems As above    Objective:   Physical Exam BP 116/80  Pulse 76  Temp(Src) 98.7 F (37.1 C) (Oral)  Wt 320 lb 1.7 oz (145.2 kg)  SpO2 98% General appearance: alert, cooperative, appears stated age and no distress Neck: no adenopathy, supple, symmetrical, trachea midline and thyroid not enlarged, symmetric, no tenderness/mass/nodules Lungs: clear to auscultation bilaterally Heart: S1, S2 normal Extremities: extremities normal, atraumatic, no cyanosis or edema        Assessment & Plan:  1. Insomnia  - Ambulatory referral to Pulmonology  2. Snoring ? Tonsils are contributing-- pt had seen ENT in past but she was told they would not take them out--she has had several episodes of tonsilitis - Ambulatory referseral to Pulmonology---sleep study - TSH - T3, free - T4, free  3. RLS (restless legs syndrome)  - pramipexole (MIRAPEX) 0.125 MG tablet; As directed  Dispense: 60 tablet; Refill: 0 - CBC with Differential - IBC panel - Ferritin - Basic metabolic panel - Hepatic function panel - TSH - T3, free - T4, free

## 2014-04-20 NOTE — Progress Notes (Signed)
Pre visit review using our clinic review tool, if applicable. No additional management support is needed unless otherwise documented below in the visit note. 

## 2014-04-20 NOTE — Patient Instructions (Signed)
Insomnia Insomnia is frequent trouble falling and/or staying asleep. Insomnia can be a long term problem or a short term problem. Both are common. Insomnia can be a short term problem when the wakefulness is related to a certain stress or worry. Long term insomnia is often related to ongoing stress during waking hours and/or poor sleeping habits. Overtime, sleep deprivation itself can make the problem worse. Every little thing feels more severe because you are overtired and your ability to cope is decreased. CAUSES   Stress, anxiety, and depression.  Poor sleeping habits.  Distractions such as TV in the bedroom.  Naps close to bedtime.  Engaging in emotionally charged conversations before bed.  Technical reading before sleep.  Alcohol and other sedatives. They may make the problem worse. They can hurt normal sleep patterns and normal dream activity.  Stimulants such as caffeine for several hours prior to bedtime.  Pain syndromes and shortness of breath can cause insomnia.  Exercise late at night.  Changing time zones may cause sleeping problems (jet lag). It is sometimes helpful to have someone observe your sleeping patterns. They should look for periods of not breathing during the night (sleep apnea). They should also look to see how long those periods last. If you live alone or observers are uncertain, you can also be observed at a sleep clinic where your sleep patterns will be professionally monitored. Sleep apnea requires a checkup and treatment. Give your caregivers your medical history. Give your caregivers observations your family has made about your sleep.  SYMPTOMS   Not feeling rested in the morning.  Anxiety and restlessness at bedtime.  Difficulty falling and staying asleep. TREATMENT   Your caregiver may prescribe treatment for an underlying medical disorders. Your caregiver can give advice or help if you are using alcohol or other drugs for self-medication. Treatment  of underlying problems will usually eliminate insomnia problems.  Medications can be prescribed for short time use. They are generally not recommended for lengthy use.  Over-the-counter sleep medicines are not recommended for lengthy use. They can be habit forming.  You can promote easier sleeping by making lifestyle changes such as:  Using relaxation techniques that help with breathing and reduce muscle tension.  Exercising earlier in the day.  Changing your diet and the time of your last meal. No night time snacks.  Establish a regular time to go to bed.  Counseling can help with stressful problems and worry.  Soothing music and white noise may be helpful if there are background noises you cannot remove.  Stop tedious detailed work at least one hour before bedtime. HOME CARE INSTRUCTIONS   Keep a diary. Inform your caregiver about your progress. This includes any medication side effects. See your caregiver regularly. Take note of:  Times when you are asleep.  Times when you are awake during the night.  The quality of your sleep.  How you feel the next day. This information will help your caregiver care for you.  Get out of bed if you are still awake after 15 minutes. Read or do some quiet activity. Keep the lights down. Wait until you feel sleepy and go back to bed.  Keep regular sleeping and waking hours. Avoid naps.  Exercise regularly.  Avoid distractions at bedtime. Distractions include watching television or engaging in any intense or detailed activity like attempting to balance the household checkbook.  Develop a bedtime ritual. Keep a familiar routine of bathing, brushing your teeth, climbing into bed at the same   time each night, listening to soothing music. Routines increase the success of falling to sleep faster.  Use relaxation techniques. This can be using breathing and muscle tension release routines. It can also include visualizing peaceful scenes. You can  also help control troubling or intruding thoughts by keeping your mind occupied with boring or repetitive thoughts like the old concept of counting sheep. You can make it more creative like imagining planting one beautiful flower after another in your backyard garden.  During your day, work to eliminate stress. When this is not possible use some of the previous suggestions to help reduce the anxiety that accompanies stressful situations. MAKE SURE YOU:   Understand these instructions.  Will watch your condition.  Will get help right away if you are not doing well or get worse. Document Released: 07/04/2000 Document Revised: 09/29/2011 Document Reviewed: 08/04/2007 Exit[employer] Patient Information 2015 [employer]. This information is not intended to replace advice given to you by your health care provider. Make sure you discuss any questions you have with your health care provider.  

## 2014-06-06 ENCOUNTER — Encounter: Payer: Self-pay | Admitting: Pulmonary Disease

## 2014-06-06 ENCOUNTER — Ambulatory Visit (INDEPENDENT_AMBULATORY_CARE_PROVIDER_SITE_OTHER): Payer: Commercial Managed Care - PPO | Admitting: Pulmonary Disease

## 2014-06-06 VITALS — BP 122/82 | HR 80 | Temp 97.1°F | Ht 71.0 in | Wt 321.6 lb

## 2014-06-06 DIAGNOSIS — G4733 Obstructive sleep apnea (adult) (pediatric): Secondary | ICD-10-CM | POA: Insufficient documentation

## 2014-06-06 DIAGNOSIS — G2581 Restless legs syndrome: Secondary | ICD-10-CM

## 2014-06-06 NOTE — Progress Notes (Signed)
Subjective:    Patient ID: Misty LandryRachel Rodriguez, female    DOB: 09/08/1975, 38 y.o.   MRN: [id number]  HPI  Chief Complaint  Patient presents with  . Advice Only    Referred by Dr. 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 She reports non-refreshing sleep. She reports creepy crawly sensations in her legs that started in the late evening and persist after she gets into bed. Her boyfriend has noted loud snoring. She has been bothered somewhat by enlarged tonsils.she reports some degree of temperature sensitivity. Epworth sleepiness score is 5 Bedtime is around 10:30 PM, she watches an episode of Seinfeld before switching off the TV, sleep latency is up to 1 hour, she sleeps on her side with one pillow, reports 3-5 nocturnal awakenings, sometimes due to restless legs, she has to get up and walk around to feel better and then get back into bed, she is out of bed by 7:30 AM feeling tired but denies dryness of mouth or headaches. She's gained 30 pounds over the last 2 years. She tried Mirapex for 2 weeks, but her leg symptoms got worse and hence she  Stopped  There is no history suggestive of cataplexy, sleep paralysis or parasomnias  Labs-iron levels normal, ferritin 24, TSH/free T4 normal  Her dad does have OSA  Past Medical History  Diagnosis Date  . PCOS (polycystic ovarian syndrome)     Past Surgical History  Procedure Laterality Date  . Ankle surgery    . Dental surgery      Allergies  Allergen Reactions  . Amoxicillin-Pot Clavulanate    History   Social History  . Marital Status: Single    Spouse Name: N/A    Number of Children: 0  . Years of Education: N/A   Occupational History  . 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 Smoking status: Never Smoker   . Smokeless tobacco: Never Used  . Alcohol Use: 0.0 oz/week    0 Not specified per week     Comment: Socially only  . Drug Use: No  . Sexual Activity: Yes   Other Topics Concern  . Not on file   Social History Narrative   Exercise--swim, pilates and walk--  Something everyday    Family History  Problem Relation Age of Onset  . Aneurysm Father   . Stroke Maternal Grandmother   . Hypertension Maternal Grandmother   . Heart disease Maternal Grandfather   . Diabetes Paternal Grandfather   . Aneurysm Paternal Grandfather   . Sleep apnea Father      Review of Systems Constitutional: negative for anorexia, fevers and sweats  Eyes: negative for irritation, redness and visual disturbance  Ears, nose, mouth, throat, and face: negative for earaches, epistaxis, nasal congestion and sore throat  Respiratory: negative for cough, dyspnea on exertion, sputum and wheezing  Cardiovascular: negative for chest pain, dyspnea, lower extremity edema, orthopnea, palpitations and syncope  Gastrointestinal: negative for abdominal pain, constipation, diarrhea, melena, nausea and vomiting  Genitourinary:negative for dysuria, frequency and hematuria  Hematologic/lymphatic: negative for bleeding, easy bruising and lymphadenopathy  Musculoskeletal:negative for arthralgias, muscle weakness and stiff joints  Neurological: negative for coordination problems, gait problems, headaches and weakness  Endocrine: negative for diabetic symptoms including polydipsia, polyuria and weight loss     Objective:   Physical Exam  Gen. Pleasant, obese, in no distress, normal affect ENT - no lesions, no post nasal drip, class 2-3 airway Neck: No JVD, no thyromegaly, no carotid bruits Lungs: no use of accessory muscles, no dullness to percussion, decreased without rales or rhonchi  Cardiovascular: Rhythm regular, heart sounds   normal, no murmurs or gallops, no peripheral edema Abdomen: soft and non-tender, no hepatosplenomegaly, BS normal. Musculoskeletal: No deformities, no cyanosis or clubbing Neuro:  alert, non focal, no tremors       Assessment & Plan:

## 2014-06-06 NOTE — Assessment & Plan Note (Signed)
Given excessive daytime fatigue, narrow pharyngeal exam, witnessed apneas & loud snoring, obstructive sleep apnea is very likely & an overnight polysomnogram will be scheduled as a split study. The pathophysiology of obstructive sleep apnea , it's cardiovascular consequences & modes of treatment including CPAP were discused with the patient in detail & they evidenced understanding.

## 2014-06-06 NOTE — Assessment & Plan Note (Signed)
Will undertake trial of requip if no OSA on PSG

## 2014-06-06 NOTE — Patient Instructions (Signed)
Sleep study You will need treatment for restless legs

## 2014-06-09 ENCOUNTER — Encounter (HOSPITAL_BASED_OUTPATIENT_CLINIC_OR_DEPARTMENT_OTHER): Payer: Commercial Managed Care - PPO

## 2014-09-01 ENCOUNTER — Ambulatory Visit (HOSPITAL_BASED_OUTPATIENT_CLINIC_OR_DEPARTMENT_OTHER): Payer: Commercial Managed Care - PPO | Attending: Pulmonary Disease | Admitting: *Deleted

## 2014-09-01 VITALS — Ht 71.0 in | Wt 325.0 lb

## 2014-09-01 DIAGNOSIS — G4700 Insomnia, unspecified: Secondary | ICD-10-CM | POA: Insufficient documentation

## 2014-09-01 DIAGNOSIS — E669 Obesity, unspecified: Secondary | ICD-10-CM | POA: Diagnosis not present

## 2014-09-01 DIAGNOSIS — G4733 Obstructive sleep apnea (adult) (pediatric): Secondary | ICD-10-CM | POA: Diagnosis not present

## 2014-09-01 DIAGNOSIS — Z6841 Body Mass Index (BMI) 40.0 and over, adult: Secondary | ICD-10-CM | POA: Insufficient documentation

## 2014-09-11 ENCOUNTER — Telehealth: Payer: Self-pay | Admitting: Pulmonary Disease

## 2014-09-11 DIAGNOSIS — G4700 Insomnia, unspecified: Secondary | ICD-10-CM | POA: Diagnosis not present

## 2014-09-11 DIAGNOSIS — G4733 Obstructive sleep apnea (adult) (pediatric): Secondary | ICD-10-CM | POA: Diagnosis not present

## 2014-09-11 DIAGNOSIS — E669 Obesity, unspecified: Secondary | ICD-10-CM | POA: Diagnosis not present

## 2014-09-11 NOTE — Telephone Encounter (Signed)
[id number]

## 2014-09-11 NOTE — Sleep Study (Signed)
[facility]   NAME: Misty LandryRachel Dexheimer  DATE OF BIRTH: 11/21/1975  MEDICAL RECORD [id number]  LOCATION: [facility]   PHYSICIAN: Chrisma Hurlock V.   DATE OF STUDY:    SLEEP STUDY TYPE: Nocturnal Polysomnogram   REFERRING PHYSICIAN: Oretha MilchAlva, Arica Bevilacqua V, MD   INDICATION FOR STUDY:  39 year old with a history suggestive of restless legs and non-refreshing sleep, snoring and enlarged tonsils At the time of this study ,they weighed 325 pounds with a height of 5 ft 11 inches and the BMI of 45, neck size of 16 inches. Epworth sleepiness score was 11   This nocturnal polysomnogram was performed with a sleep technologist in attendance. EEG, EOG,EMG and respiratory parameters recorded. Sleep stages, arousals, limb movements and respiratory data was scored according to criteria laid out by the American Academy of sleep medicine.   SLEEP ARCHITECTURE: Lights out was at 22-35 PM and lights on was at 520 AM. Total sleep time was 92 minutes with a sleep period time of 255 minutes and a sleep efficiency of 23 %. Sleep latency was 135 minutes and wake after sleep onset of 187 minutes. . Sleep stages as a percentage of total sleep time was N1 -42 %,N2- 58 % and REM sleep 0 % ( 0 minutes) . REM sleep was not noted  AROUSAL DATA : There were 70  arousals with an arousal index of 46 events per hour. Most of these were spontaneous & 3 were associated with respiratory events  RESPIRATORY DATA: There were 0 obstructive apneas, 0 central apneas, 0 mixed apneas and 1 hypopneas with apnea -hypopnea index of 0.7 events per hour. There were 35 RERAs with an RDI of 23 events per hour. There was no relation to sleep stage or body position. Supine sleep was not noted  MOVEMENT/PARASOMNIA: There were 0 PLMS with a PLM index of 0 events per hour. The PLM arousal index was 0 per hour.  OXYGEN DATA: The lowest desaturation was 92 % during nREM sleep and the desaturation index was 9 per  hour.   CARDIAC DATA: The low heart rate was 45 beats per minute. The high heart rate recorded was an artifact. No arrhythmias were noted   DISCUSSION -Loud snoring was noted . She did not meet criteria for CPAP intervention. She was desensitized with a small fullface mask  IMPRESSION :  1. Upper airway resistance syndrome causing sleep fragmentation and mild oxygen desaturation.  2. No evidence of cardiac arrhythmias,periodic limb movements or behavioral disturbance during sleep.  3. Sleep efficiency was poor. Only 90 minutes of sleep was noted she had difficulty achieving and maintaining sleep. This was worse than her normal sleep at home  RECOMMENDATION:  1. Difficult to make recommendations given only 90 minutes of sleep. Would suggest repeating home sleep study to test for obstructive sleep apnea .she did have significant respiratory effort related arousals. No significant leg movements were noted 2. Patient should be cautioned against driving when sleepy  3. They should be asked to avoid medications with sedative side effects    Oretha MilchALVA,Pier Laux V. MD Diplomate, American Board of Sleep Medicine    ELECTRONICALLY SIGNED ON: 09/11/2014   [facility]  PH: [phone] FX: [phone]  ACCREDITED BY THE AMERICAN ACADEMY OF SLEEP MEDICINE

## 2014-09-11 NOTE — Telephone Encounter (Signed)
She did not sleep much during the sleep study-hence difficult to make recommendations. Suggest a home study if she thinks she will sleep better

## 2014-09-12 NOTE — Telephone Encounter (Signed)
[id number]

## 2014-09-14 NOTE — Telephone Encounter (Signed)
Patient thinks that she had an allergic reaction to something in the sleep lab, she said she felt like she couldn't breath while she was there and that is why she couldn't sleep.  She said that an hour after she got home, she felt better.  She says that she doesn't know if she could do a home sleep study because she has problems with restless legs and she is afraid that it would interfere with the study.  Also, she said that the sleep study she had at the lab cost a lot of money out-of-pocket and she doesn't think she can afford another study.  Wants to know what you recommend.

## 2014-09-27 NOTE — Telephone Encounter (Signed)
Trial of requip 0.5 mg qhs x 4 weeks & follow up after Can increase dose -if no effect at this does

## 2014-09-28 NOTE — Telephone Encounter (Signed)
[id number]

## 2014-10-02 MED ORDER — ROPINIROLE HCL 0.5 MG PO TABS: 0.5000 mg | ORAL_TABLET | Freq: Every day | ORAL | Status: DC

## 2014-10-02 NOTE — Telephone Encounter (Signed)
Rx sent to pharmacy.  Patient notified.  Patient scheduled for follow up with Dr. Vassie LollAlva.  Nothing further needed.

## 2014-11-08 ENCOUNTER — Encounter: Payer: Self-pay | Admitting: Podiatry

## 2014-11-08 ENCOUNTER — Ambulatory Visit (INDEPENDENT_AMBULATORY_CARE_PROVIDER_SITE_OTHER): Payer: Commercial Managed Care - PPO | Admitting: Podiatry

## 2014-11-08 VITALS — BP 150/91 | HR 73 | Ht 71.0 in | Wt 325.0 lb

## 2014-11-08 DIAGNOSIS — M79673 Pain in unspecified foot: Secondary | ICD-10-CM | POA: Diagnosis not present

## 2014-11-08 DIAGNOSIS — M722 Plantar fascial fibromatosis: Secondary | ICD-10-CM | POA: Diagnosis not present

## 2014-11-08 DIAGNOSIS — M216X9 Other acquired deformities of unspecified foot: Secondary | ICD-10-CM

## 2014-11-08 DIAGNOSIS — M21969 Unspecified acquired deformity of unspecified lower leg: Secondary | ICD-10-CM | POA: Diagnosis not present

## 2014-11-08 NOTE — Progress Notes (Signed)
Subjective: 39 year old female presents complaining of heel pain duration of 3-4 months. Very bad. Having difficulty walking and asking cortisone injection. Also wants to have painful little toes corrected. She is also suffering from right knee pain.   Objective: Dermatologic: Digital corn 5th digits at DIPJ bilateral, painful. Light broad callus under ball of both feet. Vascular: All pedal pulses are palpable.  Orthopedic: Contracted 5th digit bilateral, short and hypermobile first ray bilateral, increased forefoot varus with STJ hyperpronation.  Tight Achilles tendon on right lower limb.  Pain under left heel with weight bearing.  Previous X-ray reviewed again that reveal short first metatarsal length, right (-3), left (-4).  Positive of elevated first ray bilateral. Reveal no acute osseous changes, no significant change in rearfoot joint, Subtalar joint or mid foot area.  Noted of severely contracted lesser digits.   Assessment: 1. 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 2. Short first ray with hypermobility bilateral. 3. Plantar fasciitis left heel. 3. Forefoot varus bilateral with Subtalar joint hyperpronation. 4. Ankle equinus right.   Plan: Reviewed clinical findings and available treatment options; stretch exercise, custom orthotics, change in shoe gear or activities, and also surgical options.  Left heel Injected with mixture of 4 mg Dexamethasone, 4 mg Triamcinolone, and 1 cc of 0.5% Marcaine plain. Patient tolerated well without difficulty.  Both feet casted for orthotics.

## 2014-11-08 NOTE — Patient Instructions (Signed)
Seen for pain in left heel. Cortisone injection given. Casted for orthotics. Will do hammer toe repair surgery in May. Consent form reviewed.

## 2014-11-15 ENCOUNTER — Encounter: Payer: Self-pay | Admitting: Pulmonary Disease

## 2014-11-15 ENCOUNTER — Ambulatory Visit (INDEPENDENT_AMBULATORY_CARE_PROVIDER_SITE_OTHER): Payer: Commercial Managed Care - PPO | Admitting: Pulmonary Disease

## 2014-11-15 VITALS — BP 120/72 | HR 84 | Ht 71.0 in | Wt 333.0 lb

## 2014-11-15 DIAGNOSIS — G2581 Restless legs syndrome: Secondary | ICD-10-CM | POA: Diagnosis not present

## 2014-11-15 DIAGNOSIS — E669 Obesity, unspecified: Secondary | ICD-10-CM | POA: Diagnosis not present

## 2014-11-15 DIAGNOSIS — G4733 Obstructive sleep apnea (adult) (pediatric): Secondary | ICD-10-CM

## 2014-11-15 NOTE — Progress Notes (Signed)
   Subjective:    Patient ID: Misty Rodriguez, female    DOB: 02/04/1976, 39 y.o.   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 She reports creepy crawly sensations in her legs that started in the late evening and persist after she gets into bed. Her boyfriend has noted loud snoring. She has been bothered somewhat by enlarged tonsils.she reports some degree of temperature sensitivity. Epworth sleepiness score is 5 She gained 30 pounds over the last 2 years. She tried Mirapex for 2 weeks, but her leg symptoms got worse and hence she  Stopped   Labs-iron levels normal, ferritin 24, TSH/free T4 normal  Her dad does have OSA  Chief Complaint  Patient presents with  . Follow-up    Pt's sleep study done on 09/04/14. Pt states ropinirole has helped some with restless leg. States she wakes up several times a night with insomina.    PSG 08/2014 >> TST 92 mins, RDI 23/h c/w UARS Trial of requip 0.5 mg qhs helpd some -legs 30 % better, taking this right at bedtime , remains tired daytime Unable to exercise, lose wt due to plantar fascitis  Review of Systems neg for any significant sore throat, dysphagia, itching, sneezing, nasal congestion or excess/ purulent secretions, fever, chills, sweats, unintended wt loss, pleuritic or exertional cp, hempoptysis, orthopnea pnd or change in chronic leg swelling. Also denies presyncope, palpitations, heartburn, abdominal pain, nausea, vomiting, diarrhea or change in bowel or urinary habits, dysuria,hematuria, rash, arthralgias, visual complaints, headache, numbness weakness or ataxia.     Objective:   Physical Exam  Gen. Pleasant, obese, in no distress ENT - no lesions, no post nasal drip Neck: No JVD, no thyromegaly, no carotid bruits Lungs: no use of accessory muscles, no dullness to percussion, decreased without rales or rhonchi  Cardiovascular: Rhythm  regular, heart sounds  normal, no murmurs or gallops, no peripheral edema Musculoskeletal: No deformities, no cyanosis or clubbing , no tremors        Assessment & Plan:

## 2014-11-15 NOTE — Assessment & Plan Note (Signed)
Increase requip to 1 mg at bedtime & call back in 1 week to report

## 2014-11-15 NOTE — Patient Instructions (Signed)
Referral to nutritionist  Increase requip to 1 mg at bedtime & call back in 1 week to report Call back if worse - non refreshing sleep

## 2014-11-15 NOTE — Assessment & Plan Note (Signed)
Referral to nutritionist  Call back if worse - non refreshing sleep - & can proceed with home study

## 2014-11-28 ENCOUNTER — Ambulatory Visit: Payer: Commercial Managed Care - PPO | Admitting: Dietician

## 2014-12-05 ENCOUNTER — Encounter: Payer: Commercial Managed Care - PPO | Admitting: Podiatry

## 2014-12-12 ENCOUNTER — Telehealth: Payer: Self-pay | Admitting: Pulmonary Disease

## 2014-12-12 NOTE — Telephone Encounter (Signed)
Per 11/15/14 OV: Patient Instructions       Referral to nutritionist   Increase requip to 1 mg at bedtime & call back in 1 week to report Call back if worse - non refreshing sleep  ----  LMOMTCB x1

## 2014-12-13 NOTE — Telephone Encounter (Signed)
[id number]

## 2014-12-13 NOTE — Telephone Encounter (Signed)
Spoke with the pt  She states requip 1 mg works great  She wants rx called in  Please advise if this is okay, thanks

## 2014-12-13 NOTE — Telephone Encounter (Signed)
Okay to: Treatment for Requip 1 mg daily at bedtime x30 days # 3 refills

## 2014-12-14 MED ORDER — ROPINIROLE HCL 1 MG PO TABS: 1.0000 mg | ORAL_TABLET | Freq: Every day | ORAL | Status: DC

## 2014-12-14 NOTE — Telephone Encounter (Signed)
Spoke with pt, aware of dosage increase.  Med sent to verified pharmacy.  Nothing further needed.

## 2014-12-14 NOTE — Telephone Encounter (Signed)
Pt returned call  [phone]

## 2014-12-19 ENCOUNTER — Ambulatory Visit: Payer: Commercial Managed Care - PPO | Admitting: Dietician

## 2014-12-22 ENCOUNTER — Encounter: Payer: Self-pay | Admitting: Medical

## 2014-12-22 ENCOUNTER — Ambulatory Visit (INDEPENDENT_AMBULATORY_CARE_PROVIDER_SITE_OTHER): Payer: Commercial Managed Care - PPO | Admitting: Medical

## 2014-12-22 VITALS — BP 132/94 | HR 80 | Temp 98.1°F | Ht 71.0 in | Wt 342.2 lb

## 2014-12-22 DIAGNOSIS — J029 Acute pharyngitis, unspecified: Secondary | ICD-10-CM | POA: Diagnosis not present

## 2014-12-22 DIAGNOSIS — T7840XA Allergy, unspecified, initial encounter: Secondary | ICD-10-CM | POA: Diagnosis not present

## 2014-12-22 LAB — POCT RAPID STREP A (OFFICE): RAPID STREP A SCREEN: NEGATIVE

## 2014-12-22 MED ORDER — PREDNISONE 20 MG PO TABS: ORAL_TABLET | ORAL | Status: DC

## 2014-12-22 MED ORDER — METHYLPREDNISOLONE ACETATE 40 MG/ML IJ SUSP
40.0000 mg | Freq: Once | INTRAMUSCULAR | Status: AC
  Administered 2014-12-22: 40 mg via INTRAMUSCULAR

## 2014-12-22 MED ORDER — HYDROXYZINE HCL 25 MG PO TABS: 25.0000 mg | ORAL_TABLET | Freq: Three times a day (TID) | ORAL | Status: DC | PRN

## 2014-12-22 NOTE — Assessment & Plan Note (Signed)
By exam only but some features of rash could represent atypical scarletina. So I did get rapid strep as we approach weekend.

## 2014-12-22 NOTE — Assessment & Plan Note (Addendum)
Faint rash present that itches. Allergic rxn undetermined etiology. Will rx depo medrol im today. Low dose prednisone and hydroxyzine.  Give us update on Monday on how rash is.

## 2014-12-22 NOTE — Progress Notes (Signed)
Subjective:    Patient ID: Misty LandryRachel Hazel, female    DOB: 12/26/1975, 39 y.o.   MRN: [id number]  HPI  Pt in with rash. Started yesterday evening. Started on back and abdomen. Today some on arms and legs.  Pt has recent rash that itches. Reports rash occurred after no known particular exposure. On review pt does not report any suspicious exposure to soaps, creams, detergents, make up, lotions, detergents, animal exposure exposure, plants or insect bites.  Pt rash is in the area of torso arms and legs. Pt reports no shortness of breath or wheezing. . Pt is not diabetic.  Labs in 2015 looked good. Pt has no st. No fever.   LMP- today.   Review of Systems  Constitutional: Negative for fever, chills, diaphoresis, activity change and fatigue.  HENT: Negative.   Respiratory: Negative for cough, chest tightness and shortness of breath.   Cardiovascular: Negative for chest pain, palpitations and leg swelling.  Musculoskeletal: Negative for neck pain and neck stiffness.  Skin: Positive for rash.       Itching.  Neurological: Negative for dizziness and headaches.  Psychiatric/Behavioral: Negative for behavioral problems, confusion and agitation. The patient is not nervous/anxious.      Past Medical History  Diagnosis Date  . PCOS (polycystic ovarian syndrome)     History   Social History  . Marital Status: Single    Spouse Name: N/A  . Number of Children: 0  . Years of Education: N/A   Occupational History  . 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 Smoking status: Never Smoker   . Smokeless tobacco: Never Used  . Alcohol Use: 0.0 oz/week    0 Standard drinks or equivalent per week     Comment: Socially only  . Drug Use: No  . Sexual Activity: Yes   Other Topics Concern  . Not on file   Social History Narrative   Exercise--swim, pilates and walk--  Something everyday    Past Surgical History  Procedure Laterality Date  . Ankle surgery     . Dental surgery      Family History  Problem Relation Age of Onset  . Aneurysm Father   . Stroke Maternal Grandmother   . Hypertension Maternal Grandmother   . Heart disease Maternal Grandfather   . Diabetes Paternal Grandfather   . Aneurysm Paternal Grandfather   . Sleep apnea Father     Allergies  Allergen Reactions  . Amoxicillin-Pot Clavulanate     Current Outpatient Prescriptions on File Prior to Visit  Medication Sig Dispense Refill  . rOPINIRole (REQUIP) 1 MG tablet Take 1 tablet (1 mg total) by mouth at bedtime. 30 tablet 3   No current facility-administered medications on file prior to visit.    BP 132/94 mmHg  Pulse 80  Temp(Src) 98.1 F (36.7 C) (Oral)  Ht 5\' 11"  (1.803 m)  Wt 342 lb 3.2 oz (155.221 kg)  BMI 47.75 kg/m2  SpO2 98%  LMP 12/22/2014       Objective:   Physical Exam  General- No acute distress. Pleasant patient. Neck- Full range of motion, no jvd Lungs- Clear, even and unlabored. Heart- regular rate and rhythm. Neurologic- CNII- XII grossly intact.  Skin- mild scattered rash on arms. Faint red with mild prominent follicles. On pt lower back and abdomen. Has more red rash with moderate  follicles inflamed. Heent- negative except faint mild enlarged tonsils , faint pink red appearance. NO dc. No  lymphadenopathy.      Assessment & Plan:

## 2014-12-22 NOTE — Progress Notes (Signed)
Pre visit review using our clinic review tool, if applicable. No additional management support is needed unless otherwise documented below in the visit note. 

## 2014-12-22 NOTE — Patient Instructions (Signed)
Acute pharyngitis By exam only but some features of rash could represent atypical scarletina. So I did get rapid strep as we approach weekend.   Allergic reaction Faint rash present that itches. Allergic rxn undetermined etiology. Will rx depo medrol im today. Low dose prednisone and hydroxyzine.  Give us update on Monday on how rash is.      Follow up 7 days any symptoms persist  or as needed  If itching stops but raised follicle persist then consider adding doxycycline on Monday.

## 2014-12-27 ENCOUNTER — Ambulatory Visit: Payer: Commercial Managed Care - PPO | Admitting: Podiatry

## 2015-02-09 ENCOUNTER — Other Ambulatory Visit: Payer: Self-pay

## 2015-02-09 ENCOUNTER — Other Ambulatory Visit: Payer: Self-pay | Admitting: Medical

## 2015-02-09 NOTE — Telephone Encounter (Signed)
Called patient to see what type symptoms she is having. States she is not having itching nor rash and did not order refill on medication. Called pharmacy and asked to disregard Rx called in. Advised it must have been an automatic refill request.

## 2015-03-05 ENCOUNTER — Other Ambulatory Visit: Payer: Self-pay | Admitting: Pulmonary Disease

## 2015-04-30 ENCOUNTER — Telehealth: Payer: Self-pay | Admitting: Pulmonary Disease

## 2015-04-30 MED ORDER — ROPINIROLE HCL 1 MG PO TABS: 1.0000 mg | ORAL_TABLET | Freq: Every day | ORAL | Status: DC

## 2015-04-30 NOTE — Telephone Encounter (Signed)
Spoke with pt, requesting refill on requip.  This has been sent.  Nothing further needed.

## 2015-05-15 ENCOUNTER — Ambulatory Visit (INDEPENDENT_AMBULATORY_CARE_PROVIDER_SITE_OTHER): Payer: Commercial Managed Care - PPO | Admitting: Adult Health

## 2015-05-15 ENCOUNTER — Encounter: Payer: Self-pay | Admitting: Adult Health

## 2015-05-15 VITALS — BP 134/80 | HR 80 | Temp 97.4°F | Ht 72.0 in | Wt 350.0 lb

## 2015-05-15 DIAGNOSIS — J351 Hypertrophy of tonsils: Secondary | ICD-10-CM | POA: Diagnosis not present

## 2015-05-15 DIAGNOSIS — G2581 Restless legs syndrome: Secondary | ICD-10-CM | POA: Diagnosis not present

## 2015-05-15 MED ORDER — ROPINIROLE HCL 2 MG PO TABS: 2.0000 mg | ORAL_TABLET | Freq: Every day | ORAL | Status: DC

## 2015-05-15 NOTE — Patient Instructions (Addendum)
Work on some type of exercise during daytime.  Work on weight loss.  Increase Requip 2mg  1 hr before bedtime.  Refer to ENT for enlarged tonsils .  Follow up Dr. Vassie LollAlva  In 6 months and As needed

## 2015-05-16 ENCOUNTER — Encounter: Payer: Self-pay | Admitting: Adult Health

## 2015-05-16 DIAGNOSIS — J351 Hypertrophy of tonsils: Secondary | ICD-10-CM | POA: Insufficient documentation

## 2015-05-16 NOTE — Progress Notes (Signed)
   Subjective:    Patient ID: Misty Rodriguez, female    DOB: 02/22/1976, 39 y.o.   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   TEST  Labs-iron levels normal, ferritin 24, TSH/free T4 normal PSG 08/2014 >> TST 92 mins, RDI 23/h c/w UARS   05/15/15 Follow up: RLS  Patient returns for a six-month follow-up. He is followed for restless leg syndrome. She was started on Requip last visit. Says that Requip 1 mg was doing well until recently. And having more symptoms of legs crawling and restlessness, especially in the afternoons. She does have plantar fasciitis and has been unable to exercise. Weight is up 20 pounds. Last week she increased her Requip dose to 2 mg at bedtime and this has seemed to help quite a bit. She also complains of enlarged tonsils food getting trapped in them and excessive snoring. She says that she has had this problem for a long time and is frequent to get tonsillitis. She would like a referral to ENT.    Review of Systems Constitutional:   No  weight loss, night sweats,  Fevers, chills, + fatigue, or  lassitude.  HEENT:   No headaches,  Difficulty swallowing,  Tooth/dental problems, or  Sore throat,                No sneezing, itching, ear ache, nasal congestion, post nasal drip,   CV:  No chest pain,  Orthopnea, PND, swelling in lower extremities, anasarca, dizziness, palpitations, syncope.   GI  No heartburn, indigestion, abdominal pain, nausea, vomiting, diarrhea, change in bowel habits, loss of appetite, bloody stools.   Resp: No shortness of breath with exertion or at rest.  No excess mucus, no productive cough,  No non-productive cough,  No coughing up of blood.  No change in color of mucus.  No wheezing.  No chest wall deformity  Skin: no rash or lesions.  GU: no dysuria, change in color of urine, no urgency or frequency.  No flank pain, no hematuria   MS:   No joint pain or swelling.  No decreased range of motion.  No back pain.  Psych:  No change in mood or affect. No depression or anxiety.  No memory loss.         Objective:   Physical Exam GEN: A/Ox3; pleasant , NAD, morbidly obese  HEENT:  [place]/AT,  EACs-clear, TMs-wnl, NOSE-clear, THROAT-clear, no lesions, no postnasal drip or exudate noted. Tonsils 2+   NECK:  Supple w/ fair ROM; no JVD; normal carotid impulses w/o bruits; no thyromegaly or nodules palpated; no lymphadenopathy.  RESP  Clear  P & A; w/o, wheezes/ rales/ or rhonchi.no accessory muscle use, no dullness to percussion  CARD:  RRR, no m/r/g  , no peripheral edema, pulses intact, no cyanosis or clubbing.  GI:   Soft & nt; nml bowel sounds; no organomegaly or masses detected.  Musco: Warm bil, no deformities or joint swelling noted.   Neuro: alert, no focal deficits noted.    Skin: Warm, no lesions or rashes         Assessment & Plan:

## 2015-05-16 NOTE — Assessment & Plan Note (Signed)
Patient has underlying upper airway resistance syndrome, snoring, It complains of frequent tonsillitis along with food, eating trapped in her tonsils  We'll refer to ENT for evaluation.

## 2015-05-16 NOTE — Progress Notes (Signed)
Reviewed & agree with plan  

## 2015-05-16 NOTE — Assessment & Plan Note (Signed)
Uncontrolled restless leg syndrome  Discussed exercise, such as swimming that might be painful for her joint problems. Encouraged her on weight loss. Adjust Requip dose to see if this is beneficial.  Plan Work on some type of exercise during daytime.  Work on weight loss.  Increase Requip 2mg  1 hr before bedtime.  Refer to ENT for enlarged tonsils .  Follow up Dr. Vassie LollAlva  In 6 months and As needed

## 2015-05-16 NOTE — Assessment & Plan Note (Signed)
Weight loss encouraged 

## 2015-07-27 ENCOUNTER — Ambulatory Visit (INDEPENDENT_AMBULATORY_CARE_PROVIDER_SITE_OTHER): Payer: Commercial Managed Care - PPO | Admitting: Family Medicine

## 2015-07-27 ENCOUNTER — Encounter: Payer: Self-pay | Admitting: Family Medicine

## 2015-07-27 VITALS — BP 130/96 | HR 98 | Temp 98.6°F | Resp 16 | Ht 72.0 in | Wt 350.2 lb

## 2015-07-27 DIAGNOSIS — J018 Other acute sinusitis: Secondary | ICD-10-CM

## 2015-07-27 DIAGNOSIS — J029 Acute pharyngitis, unspecified: Secondary | ICD-10-CM | POA: Diagnosis not present

## 2015-07-27 MED ORDER — CLINDAMYCIN HCL 300 MG PO CAPS: 300.0000 mg | ORAL_CAPSULE | Freq: Three times a day (TID) | ORAL | Status: DC

## 2015-07-27 NOTE — Progress Notes (Signed)
OFFICE VISIT  07/27/2015   CC:  Chief Complaint  Patient presents with  . Sore Throat    off and on x 1 month   HPI:    Patient is a 40 y.o. Caucasian female who presents for respiratory symptoms. Feels ST on/off for a couple days over the last month.  Each time with other URI sx's that have waxed/waned and never really cleared up: started up again yesterday.  Onset yesterday with fever/chills/achy--Tm 99+.  Has had nasal congestion and runny nose last 2 weeks, with cough--that all started with a couple days of ST as well.  NO SOB, no wheezing, no chest tightness.   Mucinex, nyquil, afrin for short period.  Neti pot last couple days.  Not achy today but energy low.  Past Medical History  Diagnosis Date  . PCOS (polycystic ovarian syndrome)     Past Surgical History  Procedure Laterality Date  . Ankle surgery    . Dental surgery      Outpatient Prescriptions Prior to Visit  Medication Sig Dispense Refill  . rOPINIRole (REQUIP) 2 MG tablet Take 1 tablet (2 mg total) by mouth at bedtime. 30 tablet 5  . diclofenac (VOLTAREN) 75 MG EC tablet Take 75 mg by mouth 2 (two) times daily. Reported on 07/27/2015    . hydrOXYzine (ATARAX/VISTARIL) 25 MG tablet TAKE ONE TABLET BY MOUTH EVERY 8 HOURS AS NEEDED FOR ITCHING (Patient not taking: Reported on 05/15/2015) 15 tablet 0  . predniSONE (DELTASONE) 20 MG tablet 1 tab po q day (Patient not taking: Reported on 05/15/2015) 3 tablet 0  . rOPINIRole (REQUIP) 1 MG tablet Take 1 tablet (1 mg total) by mouth at bedtime. (Patient not taking: Reported on 07/27/2015) 30 tablet 3   No facility-administered medications prior to visit.    Allergies  Allergen Reactions  . Amoxicillin-Pot Clavulanate     ROS As per HPI  PE: Blood pressure 130/96, pulse 98, temperature 98.6 F (37 C), temperature source Oral, resp. rate 16, height 6' (1.829 m), weight 350 lb 4 oz (158.872 kg), SpO2 94 %. VS: noted--normal. Gen: alert, NAD, NONTOXIC  APPEARING. HEENT: eyes without injection, drainage, or swelling.  Ears: EACs clear, TMs with normal light reflex and landmarks.  Nose: Clear rhinorrhea, with some dried, crusty exudate adherent to mildly injected mucosa.  No purulent d/c.  No paranasal sinus TTP.  No facial swelling.  Throat and mouth without focal lesion.  No pharyngial swelling or exudate, but just a touch of pharyngeal erythema is present.  Tonsils 2+ size, symmetric, with mild erythema but no exudate.  Uvula midline w/out swelling.  No palatal petechiae. Neck: supple, no LAD.   LUNGS: CTA bilat, nonlabored resps.   CV: RRR, no m/r/g. EXT: no c/c/e SKIN: no rash   LABS:  none  IMPRESSION AND PLAN:  Prolonged URI sx's, likely bacterial rhinosinusitis with prominent PND/ST. Decided to treat with clindamycin 300 mg tid x 10d. Continue neti pot and mucinex DM and salt water gargle.  An After Visit Summary was printed and given to the patient.  FOLLOW UP: Return if symptoms worsen or fail to improve.

## 2015-07-27 NOTE — Progress Notes (Signed)
Pre visit review using our clinic review tool, if applicable. No additional management support is needed unless otherwise documented below in the visit note. 

## 2015-08-02 ENCOUNTER — Telehealth: Payer: Self-pay | Admitting: *Deleted

## 2015-08-02 MED ORDER — DOXYCYCLINE HYCLATE 100 MG PO TABS: 100.0000 mg | ORAL_TABLET | Freq: Two times a day (BID) | ORAL | Status: DC

## 2015-08-02 NOTE — Telephone Encounter (Signed)
Pt LMOM on 08/02/15 at 11:01am stating that she is having an allergic reaction to the antibiotic that Dr. Milinda CaveMcGowen prescribed for her. She stated that she has a rash and is itching. I spoke to pt and advised her to stop the antibiotic and she can take an antihistamine like zyrtec or Claritin to help with the itching. She voiced understanding. She stated that her sore throat has cleared up and her energy has improved but she still has some congestion. She wants to know if there is another antibiotic that she can take or if she is okay to go without. Please advise. Thanks.

## 2015-08-02 NOTE — Telephone Encounter (Signed)
It appears she may be almost complete with abx. If she is still feeling ill, we can call in a few days of doxycyline to cover her sinus infection. Please make certain pt is not pregnant or on BC method. Etc.

## 2015-08-02 NOTE — Telephone Encounter (Signed)
Spoke to pt and she stated that her congestion is still bad so she would like to continue an antibiotic to make sure that her infection doesn't get worse or come back. She stated that she is not pregnant and her last cycle was middle of December. [facility] [place].

## 2015-08-06 ENCOUNTER — Telehealth: Payer: Self-pay | Admitting: Family Medicine

## 2015-08-06 NOTE — Telephone Encounter (Signed)
Pt states that she does not take the flu vaccine. Scheduled her for CPE 12/04/15.

## 2015-08-06 NOTE — Telephone Encounter (Signed)
Updated.      KP 

## 2015-10-19 ENCOUNTER — Other Ambulatory Visit (INDEPENDENT_AMBULATORY_CARE_PROVIDER_SITE_OTHER): Payer: Commercial Managed Care - PPO

## 2015-10-19 ENCOUNTER — Telehealth: Payer: Self-pay | Admitting: Pulmonary Disease

## 2015-10-19 ENCOUNTER — Ambulatory Visit (INDEPENDENT_AMBULATORY_CARE_PROVIDER_SITE_OTHER): Payer: Commercial Managed Care - PPO | Admitting: Adult Health

## 2015-10-19 ENCOUNTER — Encounter: Payer: Self-pay | Admitting: Adult Health

## 2015-10-19 VITALS — BP 128/78 | HR 88 | Temp 97.8°F | Ht 71.0 in | Wt 353.0 lb

## 2015-10-19 DIAGNOSIS — G2581 Restless legs syndrome: Secondary | ICD-10-CM

## 2015-10-19 LAB — IBC PANEL
Iron: 56 ug/dL (ref 42–145)
SATURATION RATIOS: 14.2 % — AB (ref 20.0–50.0)
TRANSFERRIN: 282 mg/dL (ref 212.0–360.0)

## 2015-10-19 LAB — CBC WITH DIFFERENTIAL/PLATELET
BASOS ABS: 0 10*3/uL (ref 0.0–0.1)
BASOS PCT: 0.2 % (ref 0.0–3.0)
EOS ABS: 0.3 10*3/uL (ref 0.0–0.7)
Eosinophils Relative: 2.3 % (ref 0.0–5.0)
HEMATOCRIT: 40 % (ref 36.0–46.0)
HEMOGLOBIN: 13.6 g/dL (ref 12.0–15.0)
LYMPHS PCT: 21.4 % (ref 12.0–46.0)
Lymphs Abs: 2.4 10*3/uL (ref 0.7–4.0)
MCHC: 33.9 g/dL (ref 30.0–36.0)
MCV: 80.1 fl (ref 78.0–100.0)
Monocytes Absolute: 0.7 10*3/uL (ref 0.1–1.0)
Monocytes Relative: 6.2 % (ref 3.0–12.0)
Neutro Abs: 7.7 10*3/uL (ref 1.4–7.7)
Neutrophils Relative %: 69.9 % (ref 43.0–77.0)
Platelets: 338 10*3/uL (ref 150.0–400.0)
RBC: 4.99 Mil/uL (ref 3.87–5.11)
RDW: 14.4 % (ref 11.5–15.5)
WBC: 11 10*3/uL — AB (ref 4.0–10.5)

## 2015-10-19 MED ORDER — ROPINIROLE HCL 1 MG PO TABS: 1.0000 mg | ORAL_TABLET | Freq: Two times a day (BID) | ORAL | Status: DC

## 2015-10-19 NOTE — Patient Instructions (Addendum)
Change to Requip 1mg  2 hours before bed and then 1mg  At bedtime  .  Healthy sleep regimen .  Labs today .  Work on weight loss.  Follow up Dr. Vassie LollAlva  In 6 months and As needed

## 2015-10-19 NOTE — Progress Notes (Signed)
   Subjective:    Patient ID: Misty LandryRachel Rodriguez, female    DOB: 08/21/1975, 40 y.o.   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   TEST  Labs-iron levels normal, ferritin 24, TSH/free T4 normal PSG 08/2014 >> TST 92 mins, RDI 23/h c/w UARS   10/19/2015  Follow up: RLS  Patient returns for a six-month follow-up. He is followed for restless leg syndrome. On Requip 2mg  At bedtime  .  Says was working for a while and now does not work as good. More RLS symtposm for last month and not sleeping. Feels tired during daytime.  Says the requip 2mg  makes her nauseous .  She denies fever, chest pain, orthopnea or edema.   Been having knee injections for knee djd. And also has plantar fascitiis.  Not able to exercise, wt trending up .     Review of Systems Constitutional:   No  weight loss, night sweats,  Fevers, chills, + fatigue, or  lassitude.  HEENT:   No headaches,  Difficulty swallowing,  Tooth/dental problems, or  Sore throat,                No sneezing, itching, ear ache, nasal congestion, post nasal drip,   CV:  No chest pain,  Orthopnea, PND, swelling in lower extremities, anasarca, dizziness, palpitations, syncope.   GI  No heartburn, indigestion, abdominal pain, nausea, vomiting, diarrhea, change in bowel habits, loss of appetite, bloody stools.   Resp:   No chest wall deformity  Skin: no rash or lesions.  GU: no dysuria, change in color of urine, no urgency or frequency.  No flank pain, no hematuria   MS:  No joint pain or swelling.  No decreased range of motion.  No back pain.  Psych:  No change in mood or affect. No depression or anxiety.  No memory loss.         Objective:   Physical Exam  Filed Vitals:   10/19/15 1050  BP: 128/78  Pulse: 88  Temp: 97.8 F (36.6 C)  TempSrc: Oral  Height: 5\' 11"  (1.803 m)  Weight: 353 lb (160.12 kg)  SpO2: 100%    GEN: A/Ox3;  pleasant , NAD, morbidly obese  HEENT:  [place]/AT,  EACs-clear, TMs-wnl, NOSE-clear, THROAT-clear, no lesions, no postnasal drip or exudate noted.  NECK:  Supple w/ fair ROM; no JVD; normal carotid impulses w/o bruits; no thyromegaly or nodules palpated; no lymphadenopathy.  RESP  Clear  P & A; w/o, wheezes/ rales/ or rhonchi.no accessory muscle use, no dullness to percussion  CARD:  RRR, no m/r/g  , no peripheral edema, pulses intact, no cyanosis or clubbing.  GI:   Soft & nt; nml bowel sounds; no organomegaly or masses detected.  Musco: Warm bil, no deformities or joint swelling noted.   Neuro: alert, no focal deficits noted.    Skin: Warm, no lesions or rashes   Kyzer Blowe NP-C  [facility]  10/19/2015       Assessment & Plan:

## 2015-10-19 NOTE — Assessment & Plan Note (Signed)
Healthy sleep regimen discussed  Repeat iron levels.   Plan  Change to Requip 1mg  2 hours before bed and then 1mg  At bedtime  .  Healthy sleep regimen .  Labs today .  Work on weight loss.  Follow up Dr. Vassie LollAlva  In 6 months and As needed

## 2015-10-19 NOTE — Telephone Encounter (Signed)
Spoke with pt. States that she has not slept well in the past 2 weeks. Feels like her BP is up do to this. Would like to be seen before the weekend. She has been added on to TP's schedule today 10:45am. Nothing further was needed.

## 2015-10-19 NOTE — Progress Notes (Signed)
Quick Note:  LVM for pt to return call ______ 

## 2015-10-22 NOTE — Progress Notes (Signed)
Quick Note:  LVM for pt to return call ______ 

## 2015-10-22 NOTE — Progress Notes (Signed)
Reviewed & agree with plan  

## 2015-10-23 NOTE — Progress Notes (Signed)
Quick Note:  LVM for pt to return call ______ 

## 2015-10-25 ENCOUNTER — Ambulatory Visit (INDEPENDENT_AMBULATORY_CARE_PROVIDER_SITE_OTHER): Payer: Commercial Managed Care - PPO | Admitting: Podiatry

## 2015-10-25 ENCOUNTER — Ambulatory Visit (INDEPENDENT_AMBULATORY_CARE_PROVIDER_SITE_OTHER): Payer: Commercial Managed Care - PPO

## 2015-10-25 DIAGNOSIS — M722 Plantar fascial fibromatosis: Secondary | ICD-10-CM | POA: Diagnosis not present

## 2015-10-25 MED ORDER — TRIAMCINOLONE ACETONIDE 10 MG/ML IJ SUSP
10.0000 mg | Freq: Once | INTRAMUSCULAR | Status: AC
  Administered 2015-10-25: 10 mg

## 2015-10-25 NOTE — Patient Instructions (Signed)
Plantar Fasciitis Plantar fasciitis is a painful foot condition that affects the heel. It occurs when the band of tissue that connects the toes to the heel bone (plantar fascia) becomes irritated. This can happen after exercising too much or doing other repetitive activities (overuse injury). The pain from plantar fasciitis can range from mild irritation to severe pain that makes it difficult for you to walk or move. The pain is usually worse in the morning or after you have been sitting or lying down for a while. CAUSES This condition may be caused by:  Standing for long periods of time.  Wearing shoes that do not fit.  Doing high-impact activities, including running, aerobics, and ballet.  Being overweight.  Having an abnormal way of walking (gait).  Having tight calf muscles.  Having high arches in your feet.  Starting a new athletic activity. SYMPTOMS The main symptom of this condition is heel pain. Other symptoms include:  Pain that gets worse after activity or exercise.  Pain that is worse in the morning or after resting.  Pain that goes away after you walk for a few minutes. DIAGNOSIS This condition may be diagnosed based on your signs and symptoms. Your health care provider will also do a physical exam to check for:  A tender area on the bottom of your foot.  A high arch in your foot.  Pain when you move your foot.  Difficulty moving your foot. You may also need to have imaging studies to confirm the diagnosis. These can include:  X-rays.  Ultrasound.  MRI. TREATMENT  Treatment for plantar fasciitis depends on the severity of the condition. Your treatment may include:  Rest, ice, and over-the-counter pain medicines to manage your pain.  Exercises to stretch your calves and your plantar fascia.  A splint that holds your foot in a stretched, upward position while you sleep (night splint).  Physical therapy to relieve symptoms and prevent problems in the  future.  Cortisone injections to relieve severe pain.  Extracorporeal shock wave therapy (ESWT) to stimulate damaged plantar fascia with electrical impulses. It is often used as a last resort before surgery.  Surgery, if other treatments have not worked after 12 months. HOME CARE INSTRUCTIONS  Take medicines only as directed by your health care provider.  Avoid activities that cause pain.  Roll the bottom of your foot over a bag of ice or a bottle of cold water. Do this for 20 minutes, 3-4 times a day.  Perform simple stretches as directed by your health care provider.  Try wearing athletic shoes with air-sole or gel-sole cushions or soft shoe inserts.  Wear a night splint while sleeping, if directed by your health care provider.  Keep all follow-up appointments with your health care provider. PREVENTION   Do not perform exercises or activities that cause heel pain.  Consider finding low-impact activities if you continue to have problems.  Lose weight if you need to. The best way to prevent plantar fasciitis is to avoid the activities that aggravate your plantar fascia. SEEK MEDICAL CARE IF:  Your symptoms do not go away after treatment with home care measures.  Your pain gets worse.  Your pain affects your ability to move or do your daily activities.   This information is not intended to replace advice given to you by your health care provider. Make sure you discuss any questions you have with your health care provider.   Document Released: 04/01/2001 Document Revised: 03/28/2015 Document Reviewed: 05/17/2014 Elsevier   Interactive Patient Education 2016 [employer]  

## 2015-10-25 NOTE — Progress Notes (Signed)
   Subjective:    Patient ID: Misty LandryRachel Rodriguez, female    DOB: 08/16/1975, 40 y.o.   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 FOOT IS GETTING WORSE ESPECIALLY WHEN WALKING. DR. Zachery DauerBARNES PRESCRIBE CORTISONE INJECTION, PT-NO RELIEF.  Review of Systems  Musculoskeletal: Positive for gait problem.       Objective:   Physical Exam        Assessment & Plan:

## 2015-10-26 ENCOUNTER — Telehealth: Payer: Self-pay | Admitting: *Deleted

## 2015-10-26 ENCOUNTER — Encounter: Payer: Self-pay | Admitting: Podiatry

## 2015-10-26 MED ORDER — ALPRAZOLAM 0.5 MG PO TABS: ORAL_TABLET | ORAL | Status: DC

## 2015-10-26 NOTE — Progress Notes (Signed)
Subjective:     Patient ID: Misty Rodriguez, female   DOB: 01/21/1976, 40 y.o.   MRN: [id number]  HPI patient presents stating I'm having a lot of pain in the plantar of my left heel and the lateral side of the plantar heel. States it's been bothering her and making walking difficult   Review of Systems  All other systems reviewed and are negative.      Objective:   Physical Exam  Constitutional: She is oriented to person, place, and time.  Cardiovascular: Intact distal pulses.   Musculoskeletal: Normal range of motion.  Neurological: She is oriented to person, place, and time.  Skin: Skin is warm.  Nursing note and vitals reviewed.  neurovascular status intact muscle strength adequate range of motion within normal limits with patient found to have plantar discomfort in the heel and lateral plantar discomfort with inflammation and obesity as a complicating factor. Patient's found to have good digital perfusion and is well oriented 3     Assessment:     Acute plantar fasciitis plantar heel    Plan:     H&P condition reviewed and due to long-standing nature failure to respond I did do a lateral injection 3 Milligan Kenalog 5 mg Xylocaine and applied air fracture walker were to immobilize with the patient being scheduled for shockwave therapy. Explained to her shockwave and the treatment we will be doing  X-ray report indicated there is plantar spur with moderate depression of the arch noted

## 2015-10-26 NOTE — Telephone Encounter (Signed)
[facility] called for the dispense amount and # refills for Xanax 0.5mg .  Dr. Charlsie Merlesegal states #30, 0 refills. [facility]

## 2015-11-06 ENCOUNTER — Other Ambulatory Visit: Payer: Commercial Managed Care - PPO

## 2015-11-27 ENCOUNTER — Ambulatory Visit: Payer: Commercial Managed Care - PPO

## 2015-11-27 DIAGNOSIS — M722 Plantar fascial fibromatosis: Secondary | ICD-10-CM

## 2015-11-27 NOTE — Progress Notes (Signed)
Subjective:     Patient ID: Misty Rodriguez, female   DOB: 02/28/1976, 40 y.o.   MRN: [id number]  HPI Pt presents with plantar fascial pain in her left heel   Review of Systems    All other systems negative Objective:   Physical Exam    Pain upon palpation on left medial plantar fascial heel band Assessment:     Plantar fascial pain   Plan:     EPAT ESWR #1 treatment administered today to the medial mid plantar fascial band at 0.30 energy for 13.04 joules 2000 pulses tolerated well. Advised against anti-inflammatories. She is re-appointed in 1 week for 2nd EPAT EWSR therapy and re-evaluation

## 2015-12-04 ENCOUNTER — Encounter: Payer: Commercial Managed Care - PPO | Admitting: Family Medicine

## 2015-12-05 ENCOUNTER — Ambulatory Visit: Payer: Commercial Managed Care - PPO

## 2015-12-05 DIAGNOSIS — M722 Plantar fascial fibromatosis: Secondary | ICD-10-CM

## 2015-12-05 NOTE — Progress Notes (Signed)
Subjective:     Patient ID: Misty LandryRachel Luck, female   DOB: 01/17/1976, 40 y.o.   MRN: [id number]  HPI Pt presents with plantar fascial pain in her left heel   Review of Systems    All other systems negative Objective:   Physical Exam    Pain upon palpation on left medial plantar fascial heel band Assessment:     Plantar fascial pain   Plan:     ESWR #2 treatment administered today to the medial mid plantar fascial band at 0.30 energy for 16 joules 3000 pulses tolerated well. EPAT therapy administered to surrounding connective tissues. Advised against anti-inflammatories. She is re-appointed in 1 week for 3rd EPAT EWSR therapy and re-evaluation

## 2015-12-12 ENCOUNTER — Ambulatory Visit (INDEPENDENT_AMBULATORY_CARE_PROVIDER_SITE_OTHER): Payer: Commercial Managed Care - PPO

## 2015-12-12 DIAGNOSIS — M722 Plantar fascial fibromatosis: Secondary | ICD-10-CM

## 2015-12-12 NOTE — Progress Notes (Signed)
Subjective:     Patient ID: Misty LandryRachel Rodriguez, female   DOB: 04/25/1976, 40 y.o.   MRN: [id number]  HPI Pt presents with plantar fascial pain in her left heel that she states seems to have improved some Review of Systems    All other systems negative Objective:   Physical Exam    Pain upon palpation on left medial plantar fascial heel band Assessment:     Plantar fascial pain   Plan:     ESWR #3 treatment administered today to the medial mid plantar fascial band at 0.30 energy for 18 joules 3000 pulses tolerated well. EPAT therapy administered to surrounding connective tissues. Advised against anti-inflammatories. She is re-appointed in 4 weeks for re-evaluation

## 2016-01-09 ENCOUNTER — Ambulatory Visit (INDEPENDENT_AMBULATORY_CARE_PROVIDER_SITE_OTHER): Payer: Commercial Managed Care - PPO | Admitting: Podiatry

## 2016-01-09 DIAGNOSIS — M722 Plantar fascial fibromatosis: Secondary | ICD-10-CM | POA: Diagnosis not present

## 2016-01-09 MED ORDER — TRIAMCINOLONE ACETONIDE 10 MG/ML IJ SUSP
10.0000 mg | Freq: Once | INTRAMUSCULAR | Status: AC
  Administered 2016-01-09: 10 mg

## 2016-01-10 NOTE — Progress Notes (Signed)
Subjective:     Patient ID: Misty LandryRachel Rodriguez, female   DOB: 10/01/1975, 40 y.o.   MRN: [id number]  HPI patient states the pain seems better on the lateral plantar side of the heel but the medial side of the heel has become quite sore   Review of Systems     Objective:   Physical Exam Neurovascular status intact muscle strength adequate with continued discomfort in the medial side of the left plantar fascia with the central and lateral band doing much better with shockwave therapy and immobilization    Assessment:     Improve lateral band fasciitis left with medial band fasciitis noted    Plan:     Injected the medial band 3 mg Kenalog 5 mg Xylocaine and went ahead and did shockwave on the medial side 3000 shocks 20 J of energy. We then we'll see her back 1 week to repeat and then reevaluate

## 2016-01-15 ENCOUNTER — Ambulatory Visit: Payer: Commercial Managed Care - PPO

## 2016-01-15 DIAGNOSIS — M722 Plantar fascial fibromatosis: Secondary | ICD-10-CM

## 2016-01-15 NOTE — Progress Notes (Signed)
Subjective:     Patient ID: Misty Rodriguez, female   DOB: 01/21/1976, 40 y.o.   MRN: [id number]  HPI Pt presents with plantar fascial pain in her left heel that she states seems to have improved some Review of Systems    All other systems negative Objective:   Physical Exam    Pain upon palpation on left medial plantar fascial heel band Assessment:     Plantar fascial pain   Plan:     ESWR #5 treatment administered today to the medial mid plantar fascial band at 0.35 energy for 20 joules 3000 pulses tolerated well. EPAT therapy administered to surrounding connective tissues. Advised against anti-inflammatories. She is re-appointed in 4 weeks for re-evaluation

## 2016-01-31 ENCOUNTER — Ambulatory Visit (INDEPENDENT_AMBULATORY_CARE_PROVIDER_SITE_OTHER): Payer: Commercial Managed Care - PPO | Admitting: Family Medicine

## 2016-01-31 ENCOUNTER — Encounter: Payer: Self-pay | Admitting: Family Medicine

## 2016-01-31 VITALS — BP 136/89 | HR 85 | Temp 98.5°F | Ht 71.0 in | Wt 355.0 lb

## 2016-01-31 DIAGNOSIS — Z0000 Encounter for general adult medical examination without abnormal findings: Secondary | ICD-10-CM

## 2016-01-31 DIAGNOSIS — G2581 Restless legs syndrome: Secondary | ICD-10-CM

## 2016-01-31 MED ORDER — ROPINIROLE HCL 1 MG PO TABS: ORAL_TABLET | ORAL | Status: DC

## 2016-01-31 MED ORDER — ROPINIROLE HCL 1 MG PO TABS: 1.0000 mg | ORAL_TABLET | Freq: Two times a day (BID) | ORAL | Status: DC

## 2016-01-31 NOTE — Patient Instructions (Signed)
Preventive Care for Adults, Female A healthy lifestyle and preventive care can promote health and wellness. Preventive health guidelines for women include the following key practices.  A routine yearly physical is a good way to check with your health care provider about your health and preventive screening. It is a chance to share any concerns and updates on your health and to receive a thorough exam.  Visit your dentist for a routine exam and preventive care every 6 months. Brush your teeth twice a day and floss once a day. Good oral hygiene prevents tooth decay and gum disease.  The frequency of eye exams is based on your age, health, family medical history, use of contact lenses, and other factors. Follow your health care provider's recommendations for frequency of eye exams.  Eat a healthy diet. Foods like vegetables, fruits, whole grains, low-fat dairy products, and lean protein foods contain the nutrients you need without too many calories. Decrease your intake of foods high in solid fats, added sugars, and salt. Eat the right amount of calories for you.Get information about a proper diet from your health care provider, if necessary.  Regular physical exercise is one of the most important things you can do for your health. Most adults should get at least 150 minutes of moderate-intensity exercise (any activity that increases your heart rate and causes you to sweat) each week. In addition, most adults need muscle-strengthening exercises on 2 or more days a week.  Maintain a healthy weight. The body mass index (BMI) is a screening tool to identify possible weight problems. It provides an estimate of body fat based on height and weight. Your health care provider can find your BMI and can help you achieve or maintain a healthy weight.For adults 20 years and older:  A BMI below 18.5 is considered underweight.  A BMI of 18.5 to 24.9 is normal.  A BMI of 25 to 29.9 is considered overweight.  A  BMI of 30 and above is considered obese.  Maintain normal blood lipids and cholesterol levels by exercising and minimizing your intake of saturated fat. Eat a balanced diet with plenty of fruit and vegetables. Blood tests for lipids and cholesterol should begin at age 45 and be repeated every 5 years. If your lipid or cholesterol levels are high, you are over 50, or you are at high risk for heart disease, you may need your cholesterol levels checked more frequently.Ongoing high lipid and cholesterol levels should be treated with medicines if diet and exercise are not working.  If you smoke, find out from your health care provider how to quit. If you do not use tobacco, do not start.  Lung cancer screening is recommended for adults aged 45-80 years who are at high risk for developing lung cancer because of a history of smoking. A yearly low-dose CT scan of the lungs is recommended for people who have at least a 30-pack-year history of smoking and are a current smoker or have quit within the past 15 years. A pack year of smoking is smoking an average of 1 pack of cigarettes a day for 1 year (for example: 1 pack a day for 30 years or 2 packs a day for 15 years). Yearly screening should continue until the smoker has stopped smoking for at least 15 years. Yearly screening should be stopped for people who develop a health problem that would prevent them from having lung cancer treatment.  If you are pregnant, do not drink alcohol. If you are  breastfeeding, be very cautious about drinking alcohol. If you are not pregnant and choose to drink alcohol, do not have more than 1 drink per day. One drink is considered to be 12 ounces (355 mL) of beer, 5 ounces (148 mL) of wine, or 1.5 ounces (44 mL) of liquor.  Avoid use of street drugs. Do not share needles with anyone. Ask for help if you need support or instructions about stopping the use of drugs.  High blood pressure causes heart disease and increases the risk  of stroke. Your blood pressure should be checked at least every 1 to 2 years. Ongoing high blood pressure should be treated with medicines if weight loss and exercise do not work.  If you are 55-79 years old, ask your health care provider if you should take aspirin to prevent strokes.  Diabetes screening is done by taking a blood sample to check your blood glucose level after you have not eaten for a certain period of time (fasting). If you are not overweight and you do not have risk factors for diabetes, you should be screened once every 3 years starting at age 45. If you are overweight or obese and you are 40-70 years of age, you should be screened for diabetes every year as part of your cardiovascular risk assessment.  Breast cancer screening is essential preventive care for women. You should practice "breast self-awareness." This means understanding the normal appearance and feel of your breasts and may include breast self-examination. Any changes detected, no matter how small, should be reported to a health care provider. Women in their 20s and 30s should have a clinical breast exam (CBE) by a health care provider as part of a regular health exam every 1 to 3 years. After age 40, women should have a CBE every year. Starting at age 40, women should consider having a mammogram (breast X-ray test) every year. Women who have a family history of breast cancer should talk to their health care provider about genetic screening. Women at a high risk of breast cancer should talk to their health care providers about having an MRI and a mammogram every year.  Breast cancer gene (BRCA)-related cancer risk assessment is recommended for women who have family members with BRCA-related cancers. BRCA-related cancers include breast, ovarian, tubal, and peritoneal cancers. Having family members with these cancers may be associated with an increased risk for harmful changes (mutations) in the breast cancer genes BRCA1 and  BRCA2. Results of the assessment will determine the need for genetic counseling and BRCA1 and BRCA2 testing.  Your health care provider may recommend that you be screened regularly for cancer of the pelvic organs (ovaries, uterus, and vagina). This screening involves a pelvic examination, including checking for microscopic changes to the surface of your cervix (Pap test). You may be encouraged to have this screening done every 3 years, beginning at age 21.  For women ages 30-65, health care providers may recommend pelvic exams and Pap testing every 3 years, or they may recommend the Pap and pelvic exam, combined with testing for human papilloma virus (HPV), every 5 years. Some types of HPV increase your risk of cervical cancer. Testing for HPV may also be done on women of any age with unclear Pap test results.  Other health care providers may not recommend any screening for nonpregnant women who are considered low risk for pelvic cancer and who do not have symptoms. Ask your health care provider if a screening pelvic exam is right for   you.  If you have had past treatment for cervical cancer or a condition that could lead to cancer, you need Pap tests and screening for cancer for at least 20 years after your treatment. If Pap tests have been discontinued, your risk factors (such as having a new sexual partner) need to be reassessed to determine if screening should resume. Some women have medical problems that increase the chance of getting cervical cancer. In these cases, your health care provider may recommend more frequent screening and Pap tests.  Colorectal cancer can be detected and often prevented. Most routine colorectal cancer screening begins at the age of 50 years and continues through age 75 years. However, your health care provider may recommend screening at an earlier age if you have risk factors for colon cancer. On a yearly basis, your health care provider may provide home test kits to check  for hidden blood in the stool. Use of a small camera at the end of a tube, to directly examine the colon (sigmoidoscopy or colonoscopy), can detect the earliest forms of colorectal cancer. Talk to your health care provider about this at age 50, when routine screening begins. Direct exam of the colon should be repeated every 5-10 years through age 75 years, unless early forms of precancerous polyps or small growths are found.  People who are at an increased risk for hepatitis B should be screened for this virus. You are considered at high risk for hepatitis B if:  You were born in a country where hepatitis B occurs often. Talk with your health care provider about which countries are considered high risk.  Your parents were born in a high-risk country and you have not received a shot to protect against hepatitis B (hepatitis B vaccine).  You have HIV or AIDS.  You use needles to inject street drugs.  You live with, or have sex with, someone who has hepatitis B.  You get hemodialysis treatment.  You take certain medicines for conditions like cancer, organ transplantation, and autoimmune conditions.  Hepatitis C blood testing is recommended for all people born from 1945 through 1965 and any individual with known risks for hepatitis C.  Practice safe sex. Use condoms and avoid high-risk sexual practices to reduce the spread of sexually transmitted infections (STIs). STIs include gonorrhea, chlamydia, syphilis, trichomonas, herpes, HPV, and human immunodeficiency virus (HIV). Herpes, HIV, and HPV are viral illnesses that have no cure. They can result in disability, cancer, and death.  You should be screened for sexually transmitted illnesses (STIs) including gonorrhea and chlamydia if:  You are sexually active and are younger than 24 years.  You are older than 24 years and your health care provider tells you that you are at risk for this type of infection.  Your sexual activity has changed  since you were last screened and you are at an increased risk for chlamydia or gonorrhea. Ask your health care provider if you are at risk.  If you are at risk of being infected with HIV, it is recommended that you take a prescription medicine daily to prevent HIV infection. This is called preexposure prophylaxis (PrEP). You are considered at risk if:  You are sexually active and do not regularly use condoms or know the HIV status of your partner(s).  You take drugs by injection.  You are sexually active with a partner who has HIV.  Talk with your health care provider about whether you are at high risk of being infected with HIV. If   you choose to begin PrEP, you should first be tested for HIV. You should then be tested every 3 months for as long as you are taking PrEP.  Osteoporosis is a disease in which the bones lose minerals and strength with aging. This can result in serious bone fractures or breaks. The risk of osteoporosis can be identified using a bone density scan. Women ages 67 years and over and women at risk for fractures or osteoporosis should discuss screening with their health care providers. Ask your health care provider whether you should take a calcium supplement or vitamin D to reduce the rate of osteoporosis.  Menopause can be associated with physical symptoms and risks. Hormone replacement therapy is available to decrease symptoms and risks. You should talk to your health care provider about whether hormone replacement therapy is right for you.  Use sunscreen. Apply sunscreen liberally and repeatedly throughout the day. You should seek shade when your shadow is shorter than you. Protect yourself by wearing long sleeves, pants, a wide-brimmed hat, and sunglasses year round, whenever you are outdoors.  Once a month, do a whole body skin exam, using a mirror to look at the skin on your back. Tell your health care provider of new moles, moles that have irregular borders, moles that  are larger than a pencil eraser, or moles that have changed in shape or color.  Stay current with required vaccines (immunizations).  Influenza vaccine. All adults should be immunized every year.  Tetanus, diphtheria, and acellular pertussis (Td, Tdap) vaccine. Pregnant women should receive 1 dose of Tdap vaccine during each pregnancy. The dose should be obtained regardless of the length of time since the last dose. Immunization is preferred during the 27th-36th week of gestation. An adult who has not previously received Tdap or who does not know her vaccine status should receive 1 dose of Tdap. This initial dose should be followed by tetanus and diphtheria toxoids (Td) booster doses every 10 years. Adults with an unknown or incomplete history of completing a 3-dose immunization series with Td-containing vaccines should begin or complete a primary immunization series including a Tdap dose. Adults should receive a Td booster every 10 years.  Varicella vaccine. An adult without evidence of immunity to varicella should receive 2 doses or a second dose if she has previously received 1 dose. Pregnant females who do not have evidence of immunity should receive the first dose after pregnancy. This first dose should be obtained before leaving the health care facility. The second dose should be obtained 4-8 weeks after the first dose.  Human papillomavirus (HPV) vaccine. Females aged 13-26 years who have not received the vaccine previously should obtain the 3-dose series. The vaccine is not recommended for use in pregnant females. However, pregnancy testing is not needed before receiving a dose. If a female is found to be pregnant after receiving a dose, no treatment is needed. In that case, the remaining doses should be delayed until after the pregnancy. Immunization is recommended for any person with an immunocompromised condition through the age of 61 years if she did not get any or all doses earlier. During the  3-dose series, the second dose should be obtained 4-8 weeks after the first dose. The third dose should be obtained 24 weeks after the first dose and 16 weeks after the second dose.  Zoster vaccine. One dose is recommended for adults aged 30 years or older unless certain conditions are present.  Measles, mumps, and rubella (MMR) vaccine. Adults born  before 1957 generally are considered immune to measles and mumps. Adults born in 1957 or later should have 1 or more doses of MMR vaccine unless there is a contraindication to the vaccine or there is laboratory evidence of immunity to each of the three diseases. A routine second dose of MMR vaccine should be obtained at least 28 days after the first dose for students attending postsecondary schools, health care workers, or international travelers. People who received inactivated measles vaccine or an unknown type of measles vaccine during 1963-1967 should receive 2 doses of MMR vaccine. People who received inactivated mumps vaccine or an unknown type of mumps vaccine before 1979 and are at high risk for mumps infection should consider immunization with 2 doses of MMR vaccine. For females of childbearing age, rubella immunity should be determined. If there is no evidence of immunity, females who are not pregnant should be vaccinated. If there is no evidence of immunity, females who are pregnant should delay immunization until after pregnancy. Unvaccinated health care workers born before 1957 who lack laboratory evidence of measles, mumps, or rubella immunity or laboratory confirmation of disease should consider measles and mumps immunization with 2 doses of MMR vaccine or rubella immunization with 1 dose of MMR vaccine.  Pneumococcal 13-valent conjugate (PCV13) vaccine. When indicated, a person who is uncertain of his immunization history and has no record of immunization should receive the PCV13 vaccine. All adults 65 years of age and older should receive this  vaccine. An adult aged 19 years or older who has certain medical conditions and has not been previously immunized should receive 1 dose of PCV13 vaccine. This PCV13 should be followed with a dose of pneumococcal polysaccharide (PPSV23) vaccine. Adults who are at high risk for pneumococcal disease should obtain the PPSV23 vaccine at least 8 weeks after the dose of PCV13 vaccine. Adults older than 38 years of age who have normal immune system function should obtain the PPSV23 vaccine dose at least 1 year after the dose of PCV13 vaccine.  Pneumococcal polysaccharide (PPSV23) vaccine. When PCV13 is also indicated, PCV13 should be obtained first. All adults aged 65 years and older should be immunized. An adult younger than age 65 years who has certain medical conditions should be immunized. Any person who resides in a nursing home or long-term care facility should be immunized. An adult smoker should be immunized. People with an immunocompromised condition and certain other conditions should receive both PCV13 and PPSV23 vaccines. People with human immunodeficiency virus (HIV) infection should be immunized as soon as possible after diagnosis. Immunization during chemotherapy or radiation therapy should be avoided. Routine use of PPSV23 vaccine is not recommended for American Indians, [place] Natives, or people younger than 65 years unless there are medical conditions that require PPSV23 vaccine. When indicated, people who have unknown immunization and have no record of immunization should receive PPSV23 vaccine. One-time revaccination 5 years after the first dose of PPSV23 is recommended for people aged 19-64 years who have chronic kidney failure, nephrotic syndrome, asplenia, or immunocompromised conditions. People who received 1-2 doses of PPSV23 before age 65 years should receive another dose of PPSV23 vaccine at age 65 years or later if at least 5 years have passed since the previous dose. Doses of PPSV23 are not  needed for people immunized with PPSV23 at or after age 65 years.  Meningococcal vaccine. Adults with asplenia or persistent complement component deficiencies should receive 2 doses of quadrivalent meningococcal conjugate (MenACWY-D) vaccine. The doses should be obtained   at least 2 months apart. Microbiologists working with certain meningococcal bacteria, [place] recruits, people at risk during an outbreak, and people who travel to or live in countries with a high rate of meningitis should be immunized. A first-year college student up through age 34 years who is living in a residence hall should receive a dose if she did not receive a dose on or after her 16th birthday. Adults who have certain high-risk conditions should receive one or more doses of vaccine.  Hepatitis A vaccine. Adults who wish to be protected from this disease, have certain high-risk conditions, work with hepatitis A-infected animals, work in hepatitis A research labs, or travel to or work in countries with a high rate of hepatitis A should be immunized. Adults who were previously unvaccinated and who anticipate close contact with an international adoptee during the first 60 days after arrival in the [place] from a country with a high rate of hepatitis A should be immunized.  Hepatitis B vaccine. Adults who wish to be protected from this disease, have certain high-risk conditions, may be exposed to blood or other infectious body fluids, are household contacts or sex partners of hepatitis B positive people, are clients or workers in certain care facilities, or travel to or work in countries with a high rate of hepatitis B should be immunized.  Haemophilus influenzae type b (Hib) vaccine. A previously unvaccinated person with asplenia or sickle cell disease or having a scheduled splenectomy should receive 1 dose of Hib vaccine. Regardless of previous immunization, a recipient of a hematopoietic stem cell transplant should receive a  3-dose series 6-12 months after her successful transplant. Hib vaccine is not recommended for adults with HIV infection. Preventive Services / Frequency Ages 35 to 4 years  Blood pressure check.** / Every 3-5 years.  Lipid and cholesterol check.** / Every 5 years beginning at age 60.  Clinical breast exam.** / Every 3 years for women in their 71s and 10s.  BRCA-related cancer risk assessment.** / For women who have family members with a BRCA-related cancer (breast, ovarian, tubal, or peritoneal cancers).  Pap test.** / Every 2 years from ages 76 through 26. Every 3 years starting at age 61 through age 76 or 93 with a history of 3 consecutive normal Pap tests.  HPV screening.** / Every 3 years from ages 37 through ages 60 to 51 with a history of 3 consecutive normal Pap tests.  Hepatitis C blood test.** / For any individual with known risks for hepatitis C.  Skin self-exam. / Monthly.  Influenza vaccine. / Every year.  Tetanus, diphtheria, and acellular pertussis (Tdap, Td) vaccine.** / Consult your health care provider. Pregnant women should receive 1 dose of Tdap vaccine during each pregnancy. 1 dose of Td every 10 years.  Varicella vaccine.** / Consult your health care provider. Pregnant females who do not have evidence of immunity should receive the first dose after pregnancy.  HPV vaccine. / 3 doses over 6 months, if 93 and younger. The vaccine is not recommended for use in pregnant females. However, pregnancy testing is not needed before receiving a dose.  Measles, mumps, rubella (MMR) vaccine.** / You need at least 1 dose of MMR if you were born in 1957 or later. You may also need a 2nd dose. For females of childbearing age, rubella immunity should be determined. If there is no evidence of immunity, females who are not pregnant should be vaccinated. If there is no evidence of immunity, females who are  pregnant should delay immunization until after pregnancy.  Pneumococcal  13-valent conjugate (PCV13) vaccine.** / Consult your health care provider.  Pneumococcal polysaccharide (PPSV23) vaccine.** / 1 to 2 doses if you smoke cigarettes or if you have certain conditions.  Meningococcal vaccine.** / 1 dose if you are age 68 to 8 years and a Market researcher living in a residence hall, or have one of several medical conditions, you need to get vaccinated against meningococcal disease. You may also need additional booster doses.  Hepatitis A vaccine.** / Consult your health care provider.  Hepatitis B vaccine.** / Consult your health care provider.  Haemophilus influenzae type b (Hib) vaccine.** / Consult your health care provider. Ages 7 to 53 years  Blood pressure check.** / Every year.  Lipid and cholesterol check.** / Every 5 years beginning at age 25 years.  Lung cancer screening. / Every year if you are aged 11-80 years and have a 30-pack-year history of smoking and currently smoke or have quit within the past 15 years. Yearly screening is stopped once you have quit smoking for at least 15 years or develop a health problem that would prevent you from having lung cancer treatment.  Clinical breast exam.** / Every year after age 48 years.  BRCA-related cancer risk assessment.** / For women who have family members with a BRCA-related cancer (breast, ovarian, tubal, or peritoneal cancers).  Mammogram.** / Every year beginning at age 41 years and continuing for as long as you are in good health. Consult with your health care provider.  Pap test.** / Every 3 years starting at age 65 years through age 37 or 70 years with a history of 3 consecutive normal Pap tests.  HPV screening.** / Every 3 years from ages 72 years through ages 60 to 40 years with a history of 3 consecutive normal Pap tests.  Fecal occult blood test (FOBT) of stool. / Every year beginning at age 21 years and continuing until age 5 years. You may not need to do this test if you get  a colonoscopy every 10 years.  Flexible sigmoidoscopy or colonoscopy.** / Every 5 years for a flexible sigmoidoscopy or every 10 years for a colonoscopy beginning at age 35 years and continuing until age 48 years.  Hepatitis C blood test.** / For all people born from [birth year] through 1965 and any individual with known risks for hepatitis C.  Skin self-exam. / Monthly.  Influenza vaccine. / Every year.  Tetanus, diphtheria, and acellular pertussis (Tdap/Td) vaccine.** / Consult your health care provider. Pregnant women should receive 1 dose of Tdap vaccine during each pregnancy. 1 dose of Td every 10 years.  Varicella vaccine.** / Consult your health care provider. Pregnant females who do not have evidence of immunity should receive the first dose after pregnancy.  Zoster vaccine.** / 1 dose for adults aged 30 years or older.  Measles, mumps, rubella (MMR) vaccine.** / You need at least 1 dose of MMR if you were born in 1957 or later. You may also need a second dose. For females of childbearing age, rubella immunity should be determined. If there is no evidence of immunity, females who are not pregnant should be vaccinated. If there is no evidence of immunity, females who are pregnant should delay immunization until after pregnancy.  Pneumococcal 13-valent conjugate (PCV13) vaccine.** / Consult your health care provider.  Pneumococcal polysaccharide (PPSV23) vaccine.** / 1 to 2 doses if you smoke cigarettes or if you have certain conditions.  Meningococcal vaccine.** /  Consult your health care provider.  Hepatitis A vaccine.** / Consult your health care provider.  Hepatitis B vaccine.** / Consult your health care provider.  Haemophilus influenzae type b (Hib) vaccine.** / Consult your health care provider. Ages 64 years and over  Blood pressure check.** / Every year.  Lipid and cholesterol check.** / Every 5 years beginning at age 23 years.  Lung cancer screening. / Every year if you  are aged 16-80 years and have a 30-pack-year history of smoking and currently smoke or have quit within the past 15 years. Yearly screening is stopped once you have quit smoking for at least 15 years or develop a health problem that would prevent you from having lung cancer treatment.  Clinical breast exam.** / Every year after age 74 years.  BRCA-related cancer risk assessment.** / For women who have family members with a BRCA-related cancer (breast, ovarian, tubal, or peritoneal cancers).  Mammogram.** / Every year beginning at age 44 years and continuing for as long as you are in good health. Consult with your health care provider.  Pap test.** / Every 3 years starting at age 58 years through age 22 or 39 years with 3 consecutive normal Pap tests. Testing can be stopped between 65 and 70 years with 3 consecutive normal Pap tests and no abnormal Pap or HPV tests in the past 10 years.  HPV screening.** / Every 3 years from ages 64 years through ages 70 or 61 years with a history of 3 consecutive normal Pap tests. Testing can be stopped between 65 and 70 years with 3 consecutive normal Pap tests and no abnormal Pap or HPV tests in the past 10 years.  Fecal occult blood test (FOBT) of stool. / Every year beginning at age 40 years and continuing until age 27 years. You may not need to do this test if you get a colonoscopy every 10 years.  Flexible sigmoidoscopy or colonoscopy.** / Every 5 years for a flexible sigmoidoscopy or every 10 years for a colonoscopy beginning at age 7 years and continuing until age 32 years.  Hepatitis C blood test.** / For all people born from [birth year] through 1963 and any individual with known risks for hepatitis C.  Osteoporosis screening.** / A one-time screening for women ages 30 years and over and women at risk for fractures or osteoporosis.  Skin self-exam. / Monthly.  Influenza vaccine. / Every year.  Tetanus, diphtheria, and acellular pertussis (Tdap/Td)  vaccine.** / 1 dose of Td every 10 years.  Varicella vaccine.** / Consult your health care provider.  Zoster vaccine.** / 1 dose for adults aged 35 years or older.  Pneumococcal 13-valent conjugate (PCV13) vaccine.** / Consult your health care provider.  Pneumococcal polysaccharide (PPSV23) vaccine.** / 1 dose for all adults aged 46 years and older.  Meningococcal vaccine.** / Consult your health care provider.  Hepatitis A vaccine.** / Consult your health care provider.  Hepatitis B vaccine.** / Consult your health care provider.  Haemophilus influenzae type b (Hib) vaccine.** / Consult your health care provider. ** Family history and personal history of risk and conditions may change your health care provider's recommendations.   This information is not intended to replace advice given to you by your health care provider. Make sure you discuss any questions you have with your health care provider.   Document Released: 09/02/2001 Document Revised: 07/28/2014 Document Reviewed: 12/02/2010 Elsevier Interactive Patient Education [employer].

## 2016-01-31 NOTE — Progress Notes (Signed)
Pre visit review using our clinic review tool, if applicable. No additional management support is needed unless otherwise documented below in the visit note. 

## 2016-01-31 NOTE — Progress Notes (Signed)
Subjective:     Misty Rodriguez is a 40 y.o. female and is here for a comprehensive physical exam. The patient reports problems with worsening restless leg.  She takes  requip each night and it is not helping enough.   Social History   Social History  . Marital Status: Single    Spouse Name: N/A  . Number of Children: 0  . Years of Education: N/A   Occupational History  . 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 Smoking status: Never Smoker   . Smokeless tobacco: Never Used  . Alcohol Use: 0.0 oz/week    0 Standard drinks or equivalent per week     Comment: Socially only  . Drug Use: No  . Sexual Activity: Yes   Other Topics Concern  . Not on file   Social History Narrative   Exercise--swim, pilates and walk--  Something everyday   Health Maintenance  Topic Date Due  . INFLUENZA VACCINE  08/05/2016 (Originally 02/19/2016)  . HIV Screening  01/30/2017 (Originally 01/18/1993)  . PAP SMEAR  03/21/2018  . TETANUS/TDAP  08/11/2019    The following portions of the patient's history were reviewed and updated as appropriate:  She  has a past medical history of PCOS (polycystic ovarian syndrome). She  does not have any pertinent problems on file. She  has past surgical history that includes Ankle surgery and Dental surgery. Her family history includes Aneurysm in her father and paternal grandfather; Diabetes in her paternal grandfather; Heart disease in her maternal grandfather; Hypertension in her maternal grandmother; Sleep apnea in her father; Stroke in her maternal grandmother. She  reports that she has never smoked. She has never used smokeless tobacco. She reports that she drinks alcohol. She reports that she does not use illicit drugs. She has a current medication list which includes the following prescription(s): alprazolam and ropinirole. Current Outpatient Prescriptions on File Prior to Visit  Medication Sig Dispense Refill  . ALPRAZolam  (XANAX) 0.5 MG tablet One tablet every 6 hours as needed for muscles spasms. 30 tablet 0   No current facility-administered medications on file prior to visit.   She is allergic to amoxicillin-pot clavulanate and clindamycin/lincomycin..  Review of Systems Review of Systems  Constitutional: Negative for activity change, appetite change and fatigue.  HENT: Negative for hearing loss, congestion, tinnitus and ear discharge.  dentist q84m Eyes: Negative for visual disturbance  Respiratory: Negative for cough, chest tightness and shortness of breath.   Cardiovascular: Negative for chest pain, palpitations and leg swelling.  Gastrointestinal: Negative for abdominal pain, diarrhea, constipation and abdominal distention.  Genitourinary: Negative for urgency, frequency, decreased urine volume and difficulty urinating.  Musculoskeletal: Negative for back pain, arthralgias and gait problem. ---+plantar fascitis Skin: Negative for color change, pallor and rash.  Neurological: Negative for dizziness, light-headedness, numbness and headaches.  Hematological: Negative for adenopathy. Does not bruise/bleed easily.  Psychiatric/Behavioral: Negative for suicidal ideas, confusion, sleep disturbance, self-injury, dysphoric mood, decreased concentration and agitation.       Objective:    BP 136/89 mmHg  Pulse 85  Temp(Src) 98.5 F (36.9 C) (Oral)  Ht  (1.803 m)  Wt 355 lb (161.027 kg)  BMI 49.53 kg/m2  SpO2 99%  LMP 01/20/2016 General appearance: alert, cooperative, appears stated age and no distress Head: Normocephalic, without obvious abnormality, atraumatic Eyes: conjunctivae/corneas clear. PERRL, EOM's intact. Fundi benign. Ears: normal TM's and external ear canals both ears Nose: Nares normal. Septum midline. Mucosa  normal. No drainage or sinus tenderness. Throat: lips, mucosa, and tongue normal; teeth and gums normal Neck: no adenopathy, no carotid bruit, no JVD, supple, symmetrical,  trachea midline and thyroid not enlarged, symmetric, no tenderness/mass/nodules Back: symmetric, no curvature. ROM normal. No CVA tenderness. Lungs: clear to auscultation bilaterally Breasts: gyn Heart: regular rate and rhythm, S1, S2 normal, no murmur, click, rub or gallop Abdomen: soft, non-tender; bowel sounds normal; no masses,  no organomegaly Pelvic: deferred --gyn Extremities: extremities normal, atraumatic, no cyanosis or edema Pulses: 2+ and symmetric Skin: Skin color, texture, turgor normal. No rashes or lesions Lymph nodes: Cervical, supraclavicular, and axillary nodes normal. Neurologic: Alert and oriented X 3, normal strength and tone. Normal symmetric reflexes. Normal coordination and gait    Assessment:    Healthy female exam.     Plan:    ghm utd Check labs See After Visit Summary for Counseling Recommendations   1. RLS (restless legs syndrome)  - rOPINIRole (REQUIP) 1 MG tablet; 3 po before bed  Dispense: 90 tablet; Refill: 5 - Vitamin D 1,25 dihydroxy; Future - Vitamin B12; Future - PSA; Future - POCT urinalysis dipstick; Future - Lipid panel; Future - CBC with Differential/Platelet; Future - Comprehensive metabolic panel; Future  2. Preventative health care See above - Vitamin D 1,25 dihydroxy; Future - Vitamin B12; Future - PSA; Future - POCT urinalysis dipstick; Future - Lipid panel; Future - CBC with Differential/Platelet; Future - Comprehensive metabolic panel; Future

## 2016-02-05 ENCOUNTER — Other Ambulatory Visit (INDEPENDENT_AMBULATORY_CARE_PROVIDER_SITE_OTHER): Payer: Commercial Managed Care - PPO

## 2016-02-05 ENCOUNTER — Other Ambulatory Visit: Payer: Self-pay

## 2016-02-05 DIAGNOSIS — G2581 Restless legs syndrome: Secondary | ICD-10-CM

## 2016-02-05 DIAGNOSIS — Z0000 Encounter for general adult medical examination without abnormal findings: Secondary | ICD-10-CM

## 2016-02-05 LAB — PSA: PSA: 0 ng/mL — ABNORMAL LOW (ref 0.10–4.00)

## 2016-02-05 LAB — CBC WITH DIFFERENTIAL/PLATELET
BASOS PCT: 0.4 % (ref 0.0–3.0)
Basophils Absolute: 0 10*3/uL (ref 0.0–0.1)
EOS PCT: 2.9 % (ref 0.0–5.0)
Eosinophils Absolute: 0.3 10*3/uL (ref 0.0–0.7)
HCT: 40.8 % (ref 36.0–46.0)
Hemoglobin: 13.7 g/dL (ref 12.0–15.0)
LYMPHS ABS: 2.2 10*3/uL (ref 0.7–4.0)
Lymphocytes Relative: 24.7 % (ref 12.0–46.0)
MCHC: 33.6 g/dL (ref 30.0–36.0)
MCV: 82 fl (ref 78.0–100.0)
MONO ABS: 0.6 10*3/uL (ref 0.1–1.0)
Monocytes Relative: 6.2 % (ref 3.0–12.0)
NEUTROS PCT: 65.8 % (ref 43.0–77.0)
Neutro Abs: 5.8 10*3/uL (ref 1.4–7.7)
PLATELETS: 310 10*3/uL (ref 150.0–400.0)
RBC: 4.98 Mil/uL (ref 3.87–5.11)
RDW: 14.1 % (ref 11.5–15.5)
WBC: 8.9 10*3/uL (ref 4.0–10.5)

## 2016-02-05 LAB — POCT URINALYSIS DIPSTICK
Bilirubin, UA: NEGATIVE
Blood, UA: NEGATIVE
Glucose, UA: NEGATIVE
KETONES UA: NEGATIVE
LEUKOCYTES UA: NEGATIVE
Nitrite, UA: NEGATIVE
PROTEIN UA: NEGATIVE
Spec Grav, UA: 1.01
Urobilinogen, UA: 0.2
pH, UA: 6.5

## 2016-02-05 LAB — LIPID PANEL
CHOLESTEROL: 152 mg/dL (ref 0–200)
HDL: 56.8 mg/dL (ref >39.00)
LDL Cholesterol: 80 mg/dL (ref 0–99)
NONHDL: 95.44
Total CHOL/HDL Ratio: 3
Triglycerides: 78 mg/dL (ref 0.0–149.0)
VLDL: 15.6 mg/dL (ref 0.0–40.0)

## 2016-02-05 LAB — VITAMIN B12: Vitamin B-12: 377 pg/mL (ref 211–911)

## 2016-02-05 LAB — COMPREHENSIVE METABOLIC PANEL
ALBUMIN: 4 g/dL (ref 3.5–5.2)
ALK PHOS: 66 U/L (ref 39–117)
ALT: 14 U/L (ref 0–35)
AST: 14 U/L (ref 0–37)
BUN: 8 mg/dL (ref 6–23)
CALCIUM: 9.4 mg/dL (ref 8.4–10.5)
CHLORIDE: 103 meq/L (ref 96–112)
CO2: 26 mEq/L (ref 19–32)
Creatinine, Ser: 0.57 mg/dL (ref 0.40–1.20)
GFR: 126.13 mL/min (ref >60.00)
Glucose, Bld: 88 mg/dL (ref 70–99)
POTASSIUM: 4.1 meq/L (ref 3.5–5.1)
SODIUM: 135 meq/L (ref 135–145)
TOTAL PROTEIN: 7.2 g/dL (ref 6.0–8.3)
Total Bilirubin: 0.4 mg/dL (ref 0.2–1.2)

## 2016-02-05 LAB — TSH: TSH: 2.32 u[IU]/mL (ref 0.35–4.50)

## 2016-02-08 LAB — VITAMIN D 1,25 DIHYDROXY
Vitamin D 1, 25 (OH)2 Total: 60 pg/mL (ref 18–72)
Vitamin D2 1, 25 (OH)2: <8 pg/mL
Vitamin D3 1, 25 (OH)2: 60 pg/mL

## 2016-02-13 ENCOUNTER — Encounter: Payer: Self-pay | Admitting: Podiatry

## 2016-02-13 ENCOUNTER — Ambulatory Visit (INDEPENDENT_AMBULATORY_CARE_PROVIDER_SITE_OTHER): Payer: Commercial Managed Care - PPO | Admitting: Podiatry

## 2016-02-13 DIAGNOSIS — M722 Plantar fascial fibromatosis: Secondary | ICD-10-CM

## 2016-02-13 MED ORDER — TRIAMCINOLONE ACETONIDE 10 MG/ML IJ SUSP
10.0000 mg | Freq: Once | INTRAMUSCULAR | Status: AC
  Administered 2016-02-13: 10 mg

## 2016-02-14 NOTE — Progress Notes (Signed)
Subjective:     Patient ID: Misty Rodriguez, female   DOB: 1975-10-01, 40 y.o.   MRN: [id number]  HPI patient states I seem to be doing somewhat better but I have pain on the outside of the heel with the inside of the heel doing much better   Review of Systems     Objective:   Physical Exam Morbid obesity is complicating factor with patient found to be doing much better medial with discomfort in the lateral side of the heel plantar proximal    Assessment:     Fasciitis-like symptomatology still present    Plan:     Careful injection administered to the plantar lateral aspect of the fascia to try to get rid of remaining symptoms and may require more shockwave depending on response

## 2016-07-02 ENCOUNTER — Ambulatory Visit (INDEPENDENT_AMBULATORY_CARE_PROVIDER_SITE_OTHER): Payer: Commercial Managed Care - PPO | Admitting: Podiatry

## 2016-07-02 ENCOUNTER — Encounter: Payer: Self-pay | Admitting: Podiatry

## 2016-07-02 DIAGNOSIS — M722 Plantar fascial fibromatosis: Secondary | ICD-10-CM

## 2016-07-02 NOTE — Patient Instructions (Signed)

## 2016-07-02 NOTE — Progress Notes (Signed)
Subjective:     Patient ID: Marya LandryRachel Fosnaugh, female   DOB: 04/11/1976, 40 y.o.   MRN: [id number]  HPI patient presents stating that her left heel is really bothering her and she cannot be active and due to her weight it's important that she's able to be active   Review of Systems     Objective:   Physical Exam Neurovascular status intact with intense discomfort medial aspect left plantar heel with several year history and worsening of condition with any form of activity    Assessment:     Acute plantar fasciitis left with inflammation    Plan:     H&P conditions reviewed and recommended surgical intervention in this case. I reviewed surgery and discussed her weight and its association to condition and she wants to get it done understanding risk. I discussed alternative treatments complications and she is willing to accept risk and at this time signs consent form and is given extensive review of condition and treatment. She understands recovery can take 6 months and that there is no guarantee that she will not develop pain in the arch or other pathology and at this time she is encouraged to call with any other questions

## 2016-07-21 HISTORY — PX: PLANTAR FASCIECTOMY: SUR600

## 2016-07-22 ENCOUNTER — Encounter: Payer: Self-pay | Admitting: Podiatry

## 2016-07-22 DIAGNOSIS — M722 Plantar fascial fibromatosis: Secondary | ICD-10-CM | POA: Diagnosis not present

## 2016-07-30 ENCOUNTER — Encounter: Payer: Self-pay | Admitting: Podiatry

## 2016-07-30 ENCOUNTER — Telehealth: Payer: Self-pay | Admitting: Podiatry

## 2016-07-30 ENCOUNTER — Ambulatory Visit (INDEPENDENT_AMBULATORY_CARE_PROVIDER_SITE_OTHER): Payer: Commercial Managed Care - PPO | Admitting: Podiatry

## 2016-07-30 VITALS — Temp 97.9°F

## 2016-07-30 DIAGNOSIS — M722 Plantar fascial fibromatosis: Secondary | ICD-10-CM

## 2016-07-30 NOTE — Telephone Encounter (Signed)
Pt cxled appt for 1.10.18 1st POV via telephone.  I called pt to confirm and to get pt rescheduled but had to leave a message for pt to call back

## 2016-07-30 NOTE — Progress Notes (Signed)
Subjective:     Patient ID: Misty LandryRachel Trebilcock, female   DOB: 07/06/1976, 41 y.o.   MRN: [id number]  HPI patient presents stating that the left heel is doing very well and she's having minimal discomfort   Review of Systems     Objective:   Physical Exam Neurovascular status intact negative Homans sign was noted with patient's left heel doing well with stitches intact wound edges well coapted and no indications of pathology.    Assessment:     Doing well post plantar fascial surgery left    Plan:     Advised on anti-inflammatories continued boot usage and elevation and reappoint for us to recheck again in the next several weeks first stitch removal or earlier if needed

## 2016-08-13 ENCOUNTER — Ambulatory Visit (INDEPENDENT_AMBULATORY_CARE_PROVIDER_SITE_OTHER): Payer: Commercial Managed Care - PPO | Admitting: Podiatry

## 2016-08-13 DIAGNOSIS — M722 Plantar fascial fibromatosis: Secondary | ICD-10-CM | POA: Diagnosis not present

## 2016-08-13 NOTE — Progress Notes (Signed)
Subjective:     Patient ID: Misty LandryRachel Rodriguez, female   DOB: 05/22/1976, 41 y.o.   MRN: [id number]  HPI patient states heel is doing real well with minimal discomfort   Review of Systems     Objective:   Physical Exam Neurovascular status intact negative Homans sign noted with well coapted incision sites medial lateral side left foot    Assessment:     Doing well post endoscopic surgery left    Plan:     Stitches removed sterile dressing reapplied and begin gradual increase in activity at this time and reappoint to recheck

## 2016-08-18 ENCOUNTER — Other Ambulatory Visit: Payer: Self-pay | Admitting: Family Medicine

## 2016-08-18 DIAGNOSIS — G2581 Restless legs syndrome: Secondary | ICD-10-CM

## 2016-08-19 ENCOUNTER — Encounter: Payer: Self-pay | Admitting: Podiatry

## 2016-08-26 NOTE — Progress Notes (Signed)
DOS 01.02.2018 Endoscopic release medial band left heel.

## 2017-02-02 ENCOUNTER — Ambulatory Visit (INDEPENDENT_AMBULATORY_CARE_PROVIDER_SITE_OTHER): Payer: Commercial Managed Care - PPO | Admitting: Family Medicine

## 2017-02-02 ENCOUNTER — Encounter: Payer: Self-pay | Admitting: Family Medicine

## 2017-02-02 VITALS — BP 118/78 | HR 75 | Temp 98.1°F | Resp 16 | Ht 71.0 in | Wt 337.4 lb

## 2017-02-02 DIAGNOSIS — Z0000 Encounter for general adult medical examination without abnormal findings: Secondary | ICD-10-CM

## 2017-02-02 DIAGNOSIS — G2581 Restless legs syndrome: Secondary | ICD-10-CM

## 2017-02-02 DIAGNOSIS — M25561 Pain in right knee: Secondary | ICD-10-CM | POA: Diagnosis not present

## 2017-02-02 DIAGNOSIS — Z114 Encounter for screening for human immunodeficiency virus [HIV]: Secondary | ICD-10-CM

## 2017-02-02 LAB — POC URINALSYSI DIPSTICK (AUTOMATED)
BILIRUBIN UA: NEGATIVE
GLUCOSE UA: NEGATIVE
KETONES UA: NEGATIVE
Leukocytes, UA: NEGATIVE
Nitrite, UA: NEGATIVE
Protein, UA: NEGATIVE
RBC UA: NEGATIVE
SPEC GRAV UA: 1.015 (ref 1.010–1.025)
UROBILINOGEN UA: 0.2 U/dL
pH, UA: 6.5 (ref 5.0–8.0)

## 2017-02-02 LAB — COMPREHENSIVE METABOLIC PANEL
ALBUMIN: 4 g/dL (ref 3.5–5.2)
ALT: 12 U/L (ref 0–35)
AST: 15 U/L (ref 0–37)
Alkaline Phosphatase: 51 U/L (ref 39–117)
BUN: 8 mg/dL (ref 6–23)
CALCIUM: 9.5 mg/dL (ref 8.4–10.5)
CHLORIDE: 105 meq/L (ref 96–112)
CO2: 24 mEq/L (ref 19–32)
Creatinine, Ser: 0.61 mg/dL (ref 0.40–1.20)
GFR: 116.03 mL/min (ref >60.00)
Glucose, Bld: 96 mg/dL (ref 70–99)
POTASSIUM: 4.2 meq/L (ref 3.5–5.1)
Sodium: 137 mEq/L (ref 135–145)
Total Bilirubin: 0.3 mg/dL (ref 0.2–1.2)
Total Protein: 7.5 g/dL (ref 6.0–8.3)

## 2017-02-02 LAB — IBC PANEL
Iron: 94 ug/dL (ref 42–145)
Saturation Ratios: 26.9 % (ref 20.0–50.0)
Transferrin: 250 mg/dL (ref 212.0–360.0)

## 2017-02-02 LAB — LIPID PANEL
CHOL/HDL RATIO: 2
Cholesterol: 149 mg/dL (ref 0–200)
HDL: 59.5 mg/dL (ref >39.00)
LDL CALC: 72 mg/dL (ref 0–99)
NonHDL: 89.06
TRIGLYCERIDES: 85 mg/dL (ref 0.0–149.0)
VLDL: 17 mg/dL (ref 0.0–40.0)

## 2017-02-02 LAB — CBC WITH DIFFERENTIAL/PLATELET
Basophils Absolute: 0 10*3/uL (ref 0.0–0.1)
Basophils Relative: 0.4 % (ref 0.0–3.0)
EOS PCT: 3.2 % (ref 0.0–5.0)
Eosinophils Absolute: 0.3 10*3/uL (ref 0.0–0.7)
HEMATOCRIT: 42.1 % (ref 36.0–46.0)
HEMOGLOBIN: 14.1 g/dL (ref 12.0–15.0)
LYMPHS PCT: 24.2 % (ref 12.0–46.0)
Lymphs Abs: 2.1 10*3/uL (ref 0.7–4.0)
MCHC: 33.4 g/dL (ref 30.0–36.0)
MCV: 83.2 fl (ref 78.0–100.0)
Monocytes Absolute: 0.5 10*3/uL (ref 0.1–1.0)
Monocytes Relative: 5.7 % (ref 3.0–12.0)
Neutro Abs: 5.8 10*3/uL (ref 1.4–7.7)
Neutrophils Relative %: 66.5 % (ref 43.0–77.0)
Platelets: 303 10*3/uL (ref 150.0–400.0)
RBC: 5.06 Mil/uL (ref 3.87–5.11)
RDW: 14.2 % (ref 11.5–15.5)
WBC: 8.7 10*3/uL (ref 4.0–10.5)

## 2017-02-02 LAB — FERRITIN: FERRITIN: 29.7 ng/mL (ref 10.0–291.0)

## 2017-02-02 LAB — HIV ANTIBODY (ROUTINE TESTING W REFLEX): HIV: NONREACTIVE

## 2017-02-02 LAB — TSH: TSH: 2.03 u[IU]/mL (ref 0.35–4.50)

## 2017-02-02 MED ORDER — ROPINIROLE HCL 1 MG PO TABS: ORAL_TABLET | ORAL | 5 refills | Status: DC

## 2017-02-02 NOTE — Progress Notes (Addendum)
Subjective:   I acted as a Neurosurgeon for Dr. Zola Button.  Misty Rodriguez, Misty Rodriguez   Misty Rodriguez is a 41 y.o. female and is here for a comprehensive physical exam. The patient reports problems - would like to stop the Requip, because she feels that it is not working..  She has weaned down to 1/2 tab a day and is doing ok coming off but her RLS is preventing sleep.  She is interested in seeing a neurologist-- and noted the opiods are now a treatment but does not want to do that yet.   Pt also c/o pain in R knee-- she went to [facility] a little over a year ago and hylauronic acid injections which worked well.  Last few weeks it has started acting up.  She will go back to them.    Social History   Social History  . Marital status: Single    Spouse name: N/A  . Number of children: 0  . 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 Smoking status: Never Smoker  . Smokeless tobacco: Never Used  . Alcohol use 0.0 oz/week     Comment: Socially only  . Drug use: No  . Sexual activity: Yes   Other Topics Concern  . Not on file   Social History Narrative   Exercise--no   Health Maintenance  Topic Date Due  . HIV Screening  01/18/1993  . INFLUENZA VACCINE  02/18/2017  . PAP SMEAR  03/21/2018  . TETANUS/TDAP  08/11/2019    The following portions of the patient's history were reviewed and updated as appropriate:  She  has a past medical history of PCOS (polycystic ovarian syndrome). She  does not have any pertinent problems on file. She  has a past surgical history that includes Ankle surgery; Dental surgery; and Plantar fasciectomy (Left, 07/2016). Her family history includes Aneurysm in her father and paternal grandfather; Diabetes in her paternal grandfather; Heart disease in her maternal grandfather; Hypertension in her maternal grandmother; Sleep apnea in her father; Stroke in her maternal grandmother. She  reports  that she has never smoked. She has never used smokeless tobacco. She reports that she drinks alcohol. She reports that she does not use drugs. She has a current medication list which includes the following prescription(s): alprazolam and ropinirole. Current Outpatient Prescriptions on File Prior to Visit  Medication Sig Dispense Refill  . ALPRAZolam (XANAX) 0.5 MG tablet One tablet every 6 hours as needed for muscles spasms. 30 tablet 0   No current facility-administered medications on file prior to visit.    She is allergic to amoxicillin-pot clavulanate and clindamycin/lincomycin..  Review of Systems Review of Systems  Constitutional: Negative for activity change, appetite change and fatigue.  HENT: Negative for hearing loss, congestion, tinnitus and ear discharge.  dentist q24m Eyes: Negative for visual disturbance (see optho q1y -- vision corrected to 20/20 with glasses).  Respiratory: Negative for cough, chest tightness and shortness of breath.   Cardiovascular: Negative for chest pain, palpitations and leg swelling.  Gastrointestinal: Negative for abdominal pain, diarrhea, constipation and abdominal distention.  Genitourinary: Negative for urgency, frequency, decreased urine volume and difficulty urinating.  Musculoskeletal: Negative for back pain, arthralgias and gait problem.  Skin: Negative for color change, pallor and rash.  Neurological: Negative for dizziness, light-headedness, numbness and headaches.  Hematological: Negative for adenopathy. Does not bruise/bleed easily.  Psychiatric/Behavioral: Negative for suicidal ideas, confusion, sleep disturbance,  self-injury, dysphoric mood, decreased concentration and agitation.       Objective:    BP 118/78 (BP Location: Left Arm, Cuff Size: Large)   Pulse 75   Temp 98.1 F (36.7 C) (Oral)   Resp 16   Ht 5\' 11"  (1.803 m)   Wt (!) 337 lb 6.4 oz (153 kg)   LMP 01/09/2017   SpO2 98%   BMI 47.06 kg/m  General appearance:  alert, cooperative, appears stated age and no distress Head: Normocephalic, without obvious abnormality, atraumatic Eyes: conjunctivae/corneas clear. PERRL, EOM's intact. Fundi benign. Ears: normal TM's and external ear canals both ears Nose: Nares normal. Septum midline. Mucosa normal. No drainage or sinus tenderness. Throat: lips, mucosa, and tongue normal; teeth and gums normal Neck: no adenopathy, no carotid bruit, no JVD, supple, symmetrical, trachea midline and thyroid not enlarged, symmetric, no tenderness/mass/nodules Back: symmetric, no curvature. ROM normal. No CVA tenderness. Lungs: clear to auscultation bilaterally Breasts: normal appearance, no masses or tenderness Heart: regular rate and rhythm, S1, S2 normal, no murmur, click, rub or gallop Abdomen: soft, non-tender; bowel sounds normal; no masses,  no organomegaly Pelvic: deferred--gyn Extremities: extremities normal, atraumatic, no cyanosis or edema Pulses: 2+ and symmetric Skin: Skin color, texture, turgor normal. No rashes or lesions Lymph nodes: Cervical, supraclavicular, and axillary nodes normal. Neurologic: Alert and oriented X 3, normal strength and tone. Normal symmetric reflexes. Normal coordination and gait MS-- R knee pain,  + crepitus .  No swelling, pain with walking/ standing on it Going up stairs     Assessment:    Healthy female exam.     Plan:    ghm utd Check labs  See After Visit Summary for Counseling Recommendations

## 2017-02-02 NOTE — Assessment & Plan Note (Signed)
Pt will f/u with [facility]  [facility]

## 2017-02-02 NOTE — Assessment & Plan Note (Signed)
She weaned dose of requip down to 1/2 tab a day She is requesting a referral to neuro

## 2017-02-02 NOTE — Patient Instructions (Signed)
Preventive Care 18-39 Years, Female Preventive care refers to lifestyle choices and visits with your health care provider that can promote health and wellness. What does preventive care include?  A yearly physical exam. This is also called an annual well check.  Dental exams once or twice a year.  Routine eye exams. Ask your health care provider how often you should have your eyes checked.  Personal lifestyle choices, including: ? Daily care of your teeth and gums. ? Regular physical activity. ? Eating a healthy diet. ? Avoiding tobacco and drug use. ? Limiting alcohol use. ? Practicing safe sex. ? Taking vitamin and mineral supplements as recommended by your health care provider. What happens during an annual well check? The services and screenings done by your health care provider during your annual well check will depend on your age, overall health, lifestyle risk factors, and family history of disease. Counseling Your health care provider may ask you questions about your:  Alcohol use.  Tobacco use.  Drug use.  Emotional well-being.  Home and relationship well-being.  Sexual activity.  Eating habits.  Work and work Statistician.  Method of birth control.  Menstrual cycle.  Pregnancy history.  Screening You may have the following tests or measurements:  Height, weight, and BMI.  Diabetes screening. This is done by checking your blood sugar (glucose) after you have not eaten for a while (fasting).  Blood pressure.  Lipid and cholesterol levels. These may be checked every 5 years starting at age 66.  Skin check.  Hepatitis C blood test.  Hepatitis B blood test.  Sexually transmitted disease (STD) testing.  BRCA-related cancer screening. This may be done if you have a family history of breast, ovarian, tubal, or peritoneal cancers.  Pelvic exam and Pap test. This may be done every 3 years starting at age 40. Starting at age 59, this may be done every 5  years if you have a Pap test in combination with an HPV test.  Discuss your test results, treatment options, and if necessary, the need for more tests with your health care provider. Vaccines Your health care provider may recommend certain vaccines, such as:  Influenza vaccine. This is recommended every year.  Tetanus, diphtheria, and acellular pertussis (Tdap, Td) vaccine. You may need a Td booster every 10 years.  Varicella vaccine. You may need this if you have not been vaccinated.  HPV vaccine. If you are 69 or younger, you may need three doses over 6 months.  Measles, mumps, and rubella (MMR) vaccine. You may need at least one dose of MMR. You may also need a second dose.  Pneumococcal 13-valent conjugate (PCV13) vaccine. You may need this if you have certain conditions and were not previously vaccinated.  Pneumococcal polysaccharide (PPSV23) vaccine. You may need one or two doses if you smoke cigarettes or if you have certain conditions.  Meningococcal vaccine. One dose is recommended if you are age 27-21 years and a first-year college student living in a residence hall, or if you have one of several medical conditions. You may also need additional booster doses.  Hepatitis A vaccine. You may need this if you have certain conditions or if you travel or work in places where you may be exposed to hepatitis A.  Hepatitis B vaccine. You may need this if you have certain conditions or if you travel or work in places where you may be exposed to hepatitis B.  Haemophilus influenzae type b (Hib) vaccine. You may need this if  you have certain risk factors.  Talk to your health care provider about which screenings and vaccines you need and how often you need them. This information is not intended to replace advice given to you by your health care provider. Make sure you discuss any questions you have with your health care provider. Document Released: 09/02/2001 Document Revised: 03/26/2016  Document Reviewed: 05/08/2015 Elsevier Interactive Patient Education  2017 [employer].

## 2017-02-09 ENCOUNTER — Encounter: Payer: Self-pay | Admitting: Neurology

## 2017-02-24 ENCOUNTER — Telehealth: Payer: Self-pay | Admitting: Family Medicine

## 2017-02-24 MED ORDER — IRON 325 (65 FE) MG PO TABS: 1.0000 | ORAL_TABLET | Freq: Every day | ORAL | 0 refills | Status: DC

## 2017-02-24 NOTE — Telephone Encounter (Signed)
Please let patient know that her hemoglobin is normal. ferritin is low normal. Typically iron infusions are reserved for patients who are unable to tolerate oral iron. Insurance is unlikely to cover this service without her trying oral iron first. I also see that she has an upcoming appointment with neurology to discuss her restless legs.  It would be reasonable for her to add an oral iron supplement 325 mg once daily otc to see if this helps. She should follow-up with Dr. Laury AxonLowne in approximately 2-3 months for follow-up iron studies.

## 2017-02-24 NOTE — Telephone Encounter (Signed)
Called the patient informed of covering provider instructions She verbalized understanding.

## 2017-02-24 NOTE — Telephone Encounter (Signed)
Called left message to call back 

## 2017-02-24 NOTE — Telephone Encounter (Signed)
The patient has done research on restless legs. She states that when the ferritin level is on the low end of normal it may be helpful for patients with restless legs to get iron infusions.   Patient was informed PCP is out of the office and will be reviewed by other provider.

## 2017-02-24 NOTE — Telephone Encounter (Signed)
Pt called in because she received her results, her iron was very low due to a condition that she has. Pt would like to suggest infusion injections. Pt would like to discuss further.   CB: [phone]

## 2017-05-15 ENCOUNTER — Ambulatory Visit (INDEPENDENT_AMBULATORY_CARE_PROVIDER_SITE_OTHER): Payer: Commercial Managed Care - PPO | Admitting: Neurology

## 2017-05-15 ENCOUNTER — Encounter: Payer: Self-pay | Admitting: Neurology

## 2017-05-15 VITALS — BP 100/70 | HR 90 | Ht 71.0 in | Wt 328.4 lb

## 2017-05-15 DIAGNOSIS — G2581 Restless legs syndrome: Secondary | ICD-10-CM | POA: Diagnosis not present

## 2017-05-15 NOTE — Patient Instructions (Signed)
Continue iron supplementation  Please call my office once you know which center you would like to be referred to

## 2017-05-15 NOTE — Progress Notes (Signed)
[facility] Clinic Note - Initial Visit   Date: 05/15/17  Misty Rodriguez MRN: [id number] DOB: 1976/03/14   Dear Dr. Laury Axon:  Thank you for your kind referral of Misty Rodriguez for consultation of restless leg syndrome. Although her history is well known to you, please allow [place] to reiterate it for the purpose of our medical record. The patient was accompanied to the clinic by self.   History of Present Illness: Misty Rodriguez is a 41 y.o. right-handed Caucasian female with with history plantar fasciitis presenting for evaluation of restless leg syndrome.    Starting around her mid-20s, she began having creepy crawling sensation of the legs. Movement alleviates her pain. She has a lot of anxiety because of her discomfort.  She was initially on 0.25mg  requip for restless leg syndrome.  Gradually the dose was increased over the years to 1mg  at 8pm and 1.5mg  at midnight.  Earlier this summer, she started having worsening pain because of concern of augmentation, she stopped Requip in August and started ferrous sulfate 325mg  daily. Since doing this, her symptoms are not as severe, although are always present at rest.   She denies numbness/tingling, weakness, upper extremities symptoms.  Her ferritin from July 2018 was 29.7 and rechecked on 10/22 which was 56. She is very active on online forearms for restless leg syndrome and has read about a study from [facility] where intravenous iron is administered and is seeking this.  Currently, she is not taking any prescribed medications for her discomfort and instead is self-medicating with marijuana which helps and completely alleviates her discomfort.   Out-side paper records, electronic medical record, and images have been reviewed where available and summarized as:  Lab Results  Component Value Date   WBC 8.7 02/02/2017   HGB 14.1 02/02/2017   HCT 42.1 02/02/2017   MCV 83.2 02/02/2017   PLT 303.0 02/02/2017    Lab  Results  Component Value Date   FERRITIN 29.7 02/02/2017   Lab Results  Component Value Date   TSH 2.03 02/02/2017   Lab Results  Component Value Date   VITAMINB12 377 02/05/2016    Past Medical History:  Diagnosis Date  . PCOS (polycystic ovarian syndrome)     Past Surgical History:  Procedure Laterality Date  . ANKLE SURGERY    . DENTAL SURGERY    . PLANTAR FASCIECTOMY Left 07/2016     Medications:  Outpatient Encounter Prescriptions as of 05/15/2017  Medication Sig  . ALPRAZolam (XANAX) 0.5 MG tablet One tablet every 6 hours as needed for muscles spasms.  . Ferrous Sulfate (IRON) 325 (65 Fe) MG TABS Take 1 tablet by mouth daily.  . [DISCONTINUED] rOPINIRole (REQUIP) 1 MG tablet 1/2 tab po qhs   No facility-administered encounter medications on file as of 05/15/2017.      Allergies:  Allergies  Allergen Reactions  . Amoxicillin-Pot Clavulanate Hives  . Clindamycin/Lincomycin Hives    itching    Family History: Family History  Problem Relation Age of Onset  . Aneurysm Father   . Sleep apnea Father   . Stroke Maternal Grandmother   . Hypertension Maternal Grandmother   . Heart disease Maternal Grandfather   . Diabetes Paternal Grandfather   . Aneurysm Paternal Grandfather     Social History: Social History  Substance Use Topics  . Smoking status: Never Smoker  . Smokeless tobacco: Never Used  . Alcohol use 0.0 oz/week     Comment: Socially only   Social History  Social History Narrative   Exercise--no.  Lives with boyfriend in a one story home.  No children.     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     Review of Systems:  CONSTITUTIONAL: No fevers, chills, night sweats, or weight loss.   EYES: No visual changes or eye pain ENT: No hearing changes.  No history of nose bleeds.   RESPIRATORY: No cough, wheezing and shortness of breath.   CARDIOVASCULAR: Negative for chest pain, and palpitations.   GI: Negative for abdominal  discomfort, blood in stools or black stools.  No recent change in bowel habits.   GU:  No history of incontinence.   MUSCLOSKELETAL: No history of joint pain or swelling.  No myalgias.   SKIN: Negative for lesions, rash, and itching.   HEMATOLOGY/ONCOLOGY: Negative for prolonged bleeding, bruising easily, and swollen nodes.  No history of cancer.   ENDOCRINE: Negative for cold or heat intolerance, polydipsia or goiter.   PSYCH:  No depression or anxiety symptoms.   NEURO: As Above.   Vital Signs:  BP 100/70   Pulse 90   Ht 5\' 11"  (1.803 m)   Wt (!) 328 lb 6 oz (148.9 kg)   SpO2 98%   BMI 45.80 kg/m    General Medical Exam:   General:  Well appearing, comfortable.   Eyes/ENT: see cranial nerve examination.   Neck: No masses appreciated.  Full range of motion without tenderness.  No carotid bruits. Respiratory:  Clear to auscultation, good air entry bilaterally.   Cardiac:  Regular rate and rhythm, no murmur.   Extremities:  No deformities, edema, or skin discoloration.  Skin:  No rashes or lesions.  Neurological Exam: MENTAL STATUS including orientation to time, place, person, recent and remote memory, attention span and concentration, language, and fund of knowledge is normal.  Speech is not dysarthric.  CRANIAL NERVES: II:  No visual field defects.  Unremarkable fundi.   III-IV-VI: Pupils equal round and reactive to light.  Normal conjugate, extra-ocular eye movements in all directions of gaze.  No nystagmus.  No ptosis.   V:  Normal facial sensation.    VII:  Normal facial symmetry and movements.    VIII:  Normal hearing and vestibular function.   IX-X:  Normal palatal movement.   XI:  Normal shoulder shrug and head rotation.   XII:  Normal tongue strength and range of motion, no deviation or fasciculation.  MOTOR:  Motor strength is 5/5 throughout. No atrophy, fasciculations or abnormal movements.  No pronator drift.  Tone is normal.    MSRs:  Right                                                                  Left brachioradialis 2+  brachioradialis 2+  biceps 2+  biceps 2+  triceps 2+  triceps 2+  patellar 2+  patellar 2+  ankle jerk 2+  ankle jerk 2+  Hoffman no  Hoffman no  plantar response down  plantar response down   SENSORY:  Normal and symmetric perception of pinprick, vibration, and temperature.   COORDINATION/GAIT: Normal finger-to- nose-finger and heel-to-shin.  Intact rapid alternating movements bilaterally.  Able to rise from a chair without using arms.  Gait narrow based and stable.  Tandem and stressed gait intact.    IMPRESSION: Ms. Ilean SkillKimball is a 41 year old female referred to the evaluation for management of restless leg syndrome.  She was on Requip 2-3mg  at bedtime, however developed augmentation to this, and has since been discontinued.  She has noticed some improvement in the intensity of her symptoms since coming off of this medication, as well as starting iron supplements by mouth.  She has would like to start iron infusions, despite her ferritin is having improved (56).  I think we would find it challenging to have iron infusions approved in the absence of iron deficiency anemia.  She is seeking a tertiary care center for restless leg specialists who can coordinate this treatment.  I offered to start alternative oral pharmacologic medications, such as gabapentin, which was declined.  She will contact the office when she is decided which tertiary care center, she would like to be referred to.    The duration of this appointment visit was 40 minutes of face-to-face time with the patient.  Greater than 50% of this time was spent in counseling, explanation of diagnosis, planning of further management, and coordination of care.   Thank you for allowing me to participate in patient's care.  If I can answer any additional questions, I would be pleased to do so.    Sincerely,    Donika K. Allena KatzPatel, DO

## 2019-12-22 ENCOUNTER — Encounter: Payer: Self-pay | Admitting: Family Medicine

## 2019-12-22 ENCOUNTER — Other Ambulatory Visit: Payer: Self-pay

## 2019-12-22 ENCOUNTER — Ambulatory Visit: Payer: Managed Care, Other (non HMO) | Admitting: Family Medicine

## 2019-12-22 VITALS — BP 118/80 | HR 83 | Temp 97.7°F | Resp 18 | Ht 71.0 in | Wt 257.8 lb

## 2019-12-22 DIAGNOSIS — G2581 Restless legs syndrome: Secondary | ICD-10-CM

## 2019-12-22 DIAGNOSIS — F419 Anxiety disorder, unspecified: Secondary | ICD-10-CM

## 2019-12-22 DIAGNOSIS — Z862 Personal history of diseases of the blood and blood-forming organs and certain disorders involving the immune mechanism: Secondary | ICD-10-CM

## 2019-12-22 DIAGNOSIS — R0602 Shortness of breath: Secondary | ICD-10-CM

## 2019-12-22 DIAGNOSIS — R0789 Other chest pain: Secondary | ICD-10-CM

## 2019-12-22 DIAGNOSIS — R002 Palpitations: Secondary | ICD-10-CM

## 2019-12-22 DIAGNOSIS — R232 Flushing: Secondary | ICD-10-CM | POA: Diagnosis not present

## 2019-12-22 DIAGNOSIS — E282 Polycystic ovarian syndrome: Secondary | ICD-10-CM

## 2019-12-22 LAB — COMPREHENSIVE METABOLIC PANEL
ALT: 8 U/L (ref 0–35)
AST: 11 U/L (ref 0–37)
Albumin: 4.4 g/dL (ref 3.5–5.2)
Alkaline Phosphatase: 49 U/L (ref 39–117)
BUN: 6 mg/dL (ref 6–23)
CO2: 23 mEq/L (ref 19–32)
Calcium: 9.6 mg/dL (ref 8.4–10.5)
Chloride: 102 mEq/L (ref 96–112)
Creatinine, Ser: 0.64 mg/dL (ref 0.40–1.20)
GFR: 101.8 mL/min (ref >60.00)
Glucose, Bld: 97 mg/dL (ref 70–99)
Potassium: 3.9 mEq/L (ref 3.5–5.1)
Sodium: 136 mEq/L (ref 135–145)
Total Bilirubin: 0.4 mg/dL (ref 0.2–1.2)
Total Protein: 7.4 g/dL (ref 6.0–8.3)

## 2019-12-22 LAB — CBC WITH DIFFERENTIAL/PLATELET
Basophils Absolute: 0 10*3/uL (ref 0.0–0.1)
Basophils Relative: 0.5 % (ref 0.0–3.0)
Eosinophils Absolute: 0.1 10*3/uL (ref 0.0–0.7)
Eosinophils Relative: 0.8 % (ref 0.0–5.0)
HCT: 39.8 % (ref 36.0–46.0)
Hemoglobin: 13.4 g/dL (ref 12.0–15.0)
Lymphocytes Relative: 14.4 % (ref 12.0–46.0)
Lymphs Abs: 1.1 10*3/uL (ref 0.7–4.0)
MCHC: 33.7 g/dL (ref 30.0–36.0)
MCV: 85 fl (ref 78.0–100.0)
Monocytes Absolute: 0.4 10*3/uL (ref 0.1–1.0)
Monocytes Relative: 5.4 % (ref 3.0–12.0)
Neutro Abs: 6.2 10*3/uL (ref 1.4–7.7)
Neutrophils Relative %: 78.9 % — ABNORMAL HIGH (ref 43.0–77.0)
Platelets: 313 10*3/uL (ref 150.0–400.0)
RBC: 4.68 Mil/uL (ref 3.87–5.11)
RDW: 13.9 % (ref 11.5–15.5)
WBC: 7.9 10*3/uL (ref 4.0–10.5)

## 2019-12-22 LAB — VITAMIN B12: Vitamin B-12: 1406 pg/mL — ABNORMAL HIGH (ref 211–911)

## 2019-12-22 LAB — LIPID PANEL
Cholesterol: 150 mg/dL (ref 0–200)
HDL: 60.3 mg/dL (ref >39.00)
LDL Cholesterol: 77 mg/dL (ref 0–99)
NonHDL: 89.57
Total CHOL/HDL Ratio: 2
Triglycerides: 64 mg/dL (ref 0.0–149.0)
VLDL: 12.8 mg/dL (ref 0.0–40.0)

## 2019-12-22 LAB — CORTISOL: Cortisol, Plasma: 16 ug/dL

## 2019-12-22 LAB — D-DIMER, QUANTITATIVE: D-Dimer, Quant: 0.45 mcg/mL FEU (ref <0.50)

## 2019-12-22 LAB — TSH: TSH: 1.06 u[IU]/mL (ref 0.35–4.50)

## 2019-12-22 LAB — FOLLICLE STIMULATING HORMONE: FSH: 7.4 m[IU]/mL

## 2019-12-22 LAB — LUTEINIZING HORMONE: LH: 8.33 m[IU]/mL

## 2019-12-22 MED ORDER — ALPRAZOLAM 0.25 MG PO TABS: 0.2500 mg | ORAL_TABLET | Freq: Two times a day (BID) | ORAL | 0 refills | Status: DC | PRN

## 2019-12-22 MED ORDER — ALPRAZOLAM 0.5 MG PO TABS: ORAL_TABLET | ORAL | 0 refills | Status: DC

## 2019-12-22 MED ORDER — ESCITALOPRAM OXALATE 10 MG PO TABS: 10.0000 mg | ORAL_TABLET | Freq: Every day | ORAL | 2 refills | Status: DC

## 2019-12-22 NOTE — Progress Notes (Signed)
Patient ID: Misty Rodriguez, female    DOB: 04/20/1976  Age: 44 y.o. MRN: [id number]    Subjective:  Subjective  HPI Misty Rodriguez presents for f/u from episode anxiety / panic-- this occurred tues 5/18.  She was at work training when it occurred.  She became hot and flushed and weak.  She had not eaten so she tried to eat but she had trouble swallowing but she thought it was nerves/ anxiety.   She the panic attacks worsened.   Last Friday she took a few day off and she slept --- she is sleeping a lot more  She is also having dry mouth and is thirsty a lot  Review of Systems  Constitutional: Negative for appetite change, diaphoresis, fatigue and unexpected weight change.  Eyes: Negative for pain, redness and visual disturbance.  Respiratory: Positive for chest tightness. Negative for cough, shortness of breath and wheezing.   Cardiovascular: Positive for chest pain and palpitations. Negative for leg swelling.  Endocrine: Negative for cold intolerance, heat intolerance, polydipsia, polyphagia and polyuria.  Genitourinary: Negative for difficulty urinating, dysuria and frequency.  Neurological: Negative for dizziness, light-headedness, numbness and headaches.  Psychiatric/Behavioral: Positive for sleep disturbance. Negative for confusion, decreased concentration, dysphoric mood, hallucinations, self-injury and suicidal ideas. The patient is nervous/anxious. The patient is not hyperactive.     History Past Medical History:  Diagnosis Date  . PCOS (polycystic ovarian syndrome)     She has a past surgical history that includes Ankle surgery; Dental surgery; and Plantar fasciectomy (Left, 07/2016).   Her family history includes Aneurysm in her father and paternal grandfather; Diabetes in her paternal grandfather; Heart disease in her maternal grandfather; Hypertension in her maternal grandmother; Sleep apnea in her father; Stroke in her maternal grandmother.She reports that she has never smoked.  She has never used smokeless tobacco. She reports current alcohol use. She reports current drug use. Frequency: 7.00 times per week. Drug: Marijuana.  Current Outpatient Medications on File Prior to Visit  Medication Sig Dispense Refill  . Ferrous Sulfate (IRON) 325 (65 Fe) MG TABS Take 1 tablet by mouth daily. (Patient not taking: Reported on 12/22/2019) 30 each 0   No current facility-administered medications on file prior to visit.     Objective:  Objective  Physical Exam Vitals and nursing note reviewed.  Constitutional:      Appearance: She is well-developed.  HENT:     Head: Normocephalic and atraumatic.  Eyes:     Conjunctiva/sclera: Conjunctivae normal.  Neck:     Thyroid: No thyromegaly.     Vascular: No carotid bruit or JVD.  Cardiovascular:     Rate and Rhythm: Normal rate and regular rhythm.     Heart sounds: Normal heart sounds. No murmur.  Pulmonary:     Effort: Pulmonary effort is normal. No respiratory distress.     Breath sounds: Normal breath sounds. No wheezing or rales.  Chest:     Chest wall: No tenderness.  Musculoskeletal:     Cervical back: Normal range of motion and neck supple.  Neurological:     Mental Status: She is alert and oriented to person, place, and time.    BP 118/80 (BP Location: Right Arm, Patient Position: Sitting, Cuff Size: Large)   Pulse 83   Temp 97.7 F (36.5 C) (Temporal)   Resp 18   Ht 5\' 11"  (1.803 m)   Wt 257 lb 12.8 oz (116.9 kg)   SpO2 100%   BMI 35.96 kg/m  Wt Readings  from Last 3 Encounters:  12/22/19 257 lb 12.8 oz (116.9 kg)  05/15/17 (!) 328 lb 6 oz (148.9 kg)  02/02/17 (!) 337 lb 6.4 oz (153 kg)     Lab Results  Component Value Date   WBC 7.9 12/22/2019   HGB 13.4 12/22/2019   HCT 39.8 12/22/2019   PLT 313.0 12/22/2019   GLUCOSE 97 12/22/2019   CHOL 150 12/22/2019   TRIG 64.0 12/22/2019   HDL 60.30 12/22/2019   LDLCALC 77 12/22/2019   ALT 8 12/22/2019   AST 11 12/22/2019   NA 136 12/22/2019   K  3.9 12/22/2019   CL 102 12/22/2019   CREATININE 0.64 12/22/2019   BUN 6 12/22/2019   CO2 23 12/22/2019   TSH 1.06 12/22/2019   PSA 0.00 (L) 02/05/2016   ekg--nsr No results found.   Assessment & Plan:  Plan  I am having Misty Rodriguez start on ALPRAZolam and escitalopram. I am also having her maintain her Iron and ALPRAZolam.  Meds ordered this encounter  Medications  . ALPRAZolam (XANAX) 0.5 MG tablet    Sig: One tablet every 6 hours as needed for muscles spasms.    Dispense:  30 tablet    Refill:  0  . ALPRAZolam (XANAX) 0.25 MG tablet    Sig: Take 1 tablet (0.25 mg total) by mouth 2 (two) times daily as needed for anxiety.    Dispense:  20 tablet    Refill:  0  . escitalopram (LEXAPRO) 10 MG tablet    Sig: Take 1 tablet (10 mg total) by mouth daily.    Dispense:  30 tablet    Refill:  2    Problem List Items Addressed This Visit      Unprioritized   Anxiety    Start meds F/u 1 month      Relevant Medications   ALPRAZolam (XANAX) 0.5 MG tablet   ALPRAZolam (XANAX) 0.25 MG tablet   escitalopram (LEXAPRO) 10 MG tablet   Other Relevant Orders   TSH (Completed)   Vitamin B12 (Completed)   Lipid panel (Completed)   CBC with Differential/Platelet (Completed)   Comprehensive metabolic panel (Completed)   D-Dimer, Quantitative (Completed)   Cortisol (Completed)   Chest tightness - Primary    ekg normal prob due to anxiety      Relevant Orders   EKG 12-Lead (Completed)   TSH (Completed)   Vitamin B12 (Completed)   Lipid panel (Completed)   CBC with Differential/Platelet (Completed)   Comprehensive metabolic panel (Completed)   D-Dimer, Quantitative (Completed)   Estradiol (Completed)   FSH (Completed)   LH (Completed)   Cortisol (Completed)   History of anemia    Check labs       Hot flashes    Check labs       Relevant Orders   Estradiol (Completed)   FSH (Completed)   LH (Completed)   Cortisol (Completed)   Palpitations   Relevant Orders    Cardiac event monitor   PCOS (polycystic ovarian syndrome)   Relevant Orders   Testos,Total,Free and SHBG (Female)   Cortisol (Completed)   RLS (restless legs syndrome)   Relevant Orders   Iron, TIBC and Ferritin Panel (Completed)   SOB (shortness of breath)   Relevant Orders   EKG 12-Lead (Completed)   TSH (Completed)   Vitamin B12 (Completed)   Lipid panel (Completed)   CBC with Differential/Platelet (Completed)   Comprehensive metabolic panel (Completed)   D-Dimer, Quantitative (Completed)   Cortisol (  Completed)      Follow-up: Return in about 4 weeks (around 01/19/2020), or anxiety.  Donato Schultz, DO

## 2019-12-22 NOTE — Patient Instructions (Signed)

## 2019-12-23 DIAGNOSIS — R0789 Other chest pain: Secondary | ICD-10-CM | POA: Insufficient documentation

## 2019-12-23 DIAGNOSIS — R002 Palpitations: Secondary | ICD-10-CM | POA: Insufficient documentation

## 2019-12-23 DIAGNOSIS — R0602 Shortness of breath: Secondary | ICD-10-CM | POA: Insufficient documentation

## 2019-12-23 DIAGNOSIS — R232 Flushing: Secondary | ICD-10-CM | POA: Insufficient documentation

## 2019-12-23 DIAGNOSIS — E282 Polycystic ovarian syndrome: Secondary | ICD-10-CM | POA: Insufficient documentation

## 2019-12-23 DIAGNOSIS — F419 Anxiety disorder, unspecified: Secondary | ICD-10-CM | POA: Insufficient documentation

## 2019-12-23 DIAGNOSIS — Z862 Personal history of diseases of the blood and blood-forming organs and certain disorders involving the immune mechanism: Secondary | ICD-10-CM | POA: Insufficient documentation

## 2019-12-23 NOTE — Assessment & Plan Note (Signed)
Start meds F/u 1 month

## 2019-12-23 NOTE — Assessment & Plan Note (Signed)
Check labs 

## 2019-12-23 NOTE — Assessment & Plan Note (Signed)
ekg normal prob due to anxiety

## 2019-12-25 LAB — IRON,TIBC AND FERRITIN PANEL
%SAT: 23 % (calc) (ref 16–45)
Ferritin: 37 ng/mL (ref 16–232)
Iron: 68 ug/dL (ref 40–190)
TIBC: 297 mcg/dL (calc) (ref 250–450)

## 2019-12-25 LAB — TESTOS,TOTAL,FREE AND SHBG (FEMALE)
Free Testosterone: 3.6 pg/mL (ref 0.1–6.4)
Sex Hormone Binding: 79 nmol/L (ref 17–124)
Testosterone, Total, LC-MS-MS: 46 ng/dL — ABNORMAL HIGH (ref 2–45)

## 2019-12-25 LAB — ESTRADIOL: Estradiol: 53 pg/mL

## 2019-12-26 ENCOUNTER — Telehealth: Payer: Self-pay

## 2019-12-26 ENCOUNTER — Other Ambulatory Visit: Payer: Self-pay | Admitting: Family Medicine

## 2019-12-26 DIAGNOSIS — R5383 Other fatigue: Secondary | ICD-10-CM

## 2019-12-26 NOTE — Telephone Encounter (Signed)
Patient called in to speak with the nurse or Dr. Laury Axon about her Lab results. Please call the patient back at [phone]

## 2019-12-26 NOTE — Telephone Encounter (Signed)
Orders placed.

## 2019-12-26 NOTE — Telephone Encounter (Signed)
Spoke with patient.Pt states she is still experiencing extreme fatigue and states "barley making through the day." Pt states is it possible that she had mono? Pt states she had a cold about month ago and wonders if she had COVID and would a COVID antibody test. Please advise

## 2019-12-27 NOTE — Telephone Encounter (Signed)
Pt called. VM left. Advised patient to call back to set up lab appointment

## 2020-01-07 ENCOUNTER — Emergency Department (HOSPITAL_BASED_OUTPATIENT_CLINIC_OR_DEPARTMENT_OTHER): Payer: Managed Care, Other (non HMO)

## 2020-01-07 ENCOUNTER — Other Ambulatory Visit: Payer: Self-pay

## 2020-01-07 ENCOUNTER — Encounter (HOSPITAL_BASED_OUTPATIENT_CLINIC_OR_DEPARTMENT_OTHER): Payer: Self-pay | Admitting: Emergency Medicine

## 2020-01-07 ENCOUNTER — Emergency Department (HOSPITAL_BASED_OUTPATIENT_CLINIC_OR_DEPARTMENT_OTHER)
Admission: EM | Admit: 2020-01-07 | Discharge: 2020-01-07 | Disposition: A | Discharge status: Home / Self Care | Payer: Managed Care, Other (non HMO) | Attending: Emergency Medicine | Admitting: Emergency Medicine

## 2020-01-07 DIAGNOSIS — R1084 Generalized abdominal pain: Secondary | ICD-10-CM | POA: Diagnosis not present

## 2020-01-07 DIAGNOSIS — K2900 Acute gastritis without bleeding: Secondary | ICD-10-CM

## 2020-01-07 DIAGNOSIS — R1011 Right upper quadrant pain: Secondary | ICD-10-CM

## 2020-01-07 DIAGNOSIS — R109 Unspecified abdominal pain: Secondary | ICD-10-CM | POA: Diagnosis present

## 2020-01-07 DIAGNOSIS — R112 Nausea with vomiting, unspecified: Secondary | ICD-10-CM | POA: Diagnosis not present

## 2020-01-07 DIAGNOSIS — K5900 Constipation, unspecified: Secondary | ICD-10-CM | POA: Insufficient documentation

## 2020-01-07 HISTORY — DX: Restless legs syndrome: G25.81

## 2020-01-07 LAB — COMPREHENSIVE METABOLIC PANEL
ALT: 44 U/L (ref 0–44)
AST: 28 U/L (ref 15–41)
Albumin: 4.3 g/dL (ref 3.5–5.0)
Alkaline Phosphatase: 46 U/L (ref 38–126)
Anion gap: 11 (ref 5–15)
BUN: 5 mg/dL — ABNORMAL LOW (ref 6–20)
CO2: 21 mmol/L — ABNORMAL LOW (ref 22–32)
Calcium: 9.2 mg/dL (ref 8.9–10.3)
Chloride: 106 mmol/L (ref 98–111)
Creatinine, Ser: 0.66 mg/dL (ref 0.44–1.00)
GFR calc Af Amer: >60 mL/min (ref >60)
GFR calc non Af Amer: >60 mL/min (ref >60)
Glucose, Bld: 97 mg/dL (ref 70–99)
Potassium: 3.5 mmol/L (ref 3.5–5.1)
Sodium: 138 mmol/L (ref 135–145)
Total Bilirubin: 0.7 mg/dL (ref 0.3–1.2)
Total Protein: 8.1 g/dL (ref 6.5–8.1)

## 2020-01-07 LAB — URINALYSIS, ROUTINE W REFLEX MICROSCOPIC
Bilirubin Urine: NEGATIVE
Glucose, UA: NEGATIVE mg/dL
Ketones, ur: >80 mg/dL — AB
Leukocytes,Ua: NEGATIVE
Nitrite: NEGATIVE
Protein, ur: NEGATIVE mg/dL
Specific Gravity, Urine: 1.02 (ref 1.005–1.030)
pH: 5.5 (ref 5.0–8.0)

## 2020-01-07 LAB — CBC WITH DIFFERENTIAL/PLATELET
Abs Immature Granulocytes: 0.02 10*3/uL (ref 0.00–0.07)
Basophils Absolute: 0 10*3/uL (ref 0.0–0.1)
Basophils Relative: 0 %
Eosinophils Absolute: 0.2 10*3/uL (ref 0.0–0.5)
Eosinophils Relative: 2 %
HCT: 42.8 % (ref 36.0–46.0)
Hemoglobin: 14.2 g/dL (ref 12.0–15.0)
Immature Granulocytes: 0 %
Lymphocytes Relative: 13 %
Lymphs Abs: 1.2 10*3/uL (ref 0.7–4.0)
MCH: 28.5 pg (ref 26.0–34.0)
MCHC: 33.2 g/dL (ref 30.0–36.0)
MCV: 85.9 fL (ref 80.0–100.0)
Monocytes Absolute: 0.6 10*3/uL (ref 0.1–1.0)
Monocytes Relative: 6 %
Neutro Abs: 7.7 10*3/uL (ref 1.7–7.7)
Neutrophils Relative %: 79 %
Platelets: 249 10*3/uL (ref 150–400)
RBC: 4.98 MIL/uL (ref 3.87–5.11)
RDW: 13.2 % (ref 11.5–15.5)
WBC: 9.6 10*3/uL (ref 4.0–10.5)
nRBC: 0 % (ref 0.0–0.2)

## 2020-01-07 LAB — URINALYSIS, MICROSCOPIC (REFLEX)

## 2020-01-07 LAB — PREGNANCY, URINE: Preg Test, Ur: NEGATIVE

## 2020-01-07 LAB — LIPASE, BLOOD: Lipase: 22 U/L (ref 11–51)

## 2020-01-07 MED ORDER — ONDANSETRON HCL 4 MG/2ML IJ SOLN
4.0000 mg | Freq: Once | INTRAMUSCULAR | Status: AC
  Administered 2020-01-07: 4 mg via INTRAVENOUS
  Filled 2020-01-07: qty 2

## 2020-01-07 MED ORDER — SODIUM CHLORIDE 0.9 % IV BOLUS
1000.0000 mL | Freq: Once | INTRAVENOUS | Status: AC
  Administered 2020-01-07: 1000 mL via INTRAVENOUS

## 2020-01-07 MED ORDER — ONDANSETRON 4 MG PO TBDP: 4.0000 mg | ORAL_TABLET | Freq: Three times a day (TID) | ORAL | 0 refills | Status: AC | PRN

## 2020-01-07 NOTE — ED Notes (Signed)
Attempted blood draw in right AC unsuccessful. Pt wished to receive IV fluids before attempting blood draw again.

## 2020-01-07 NOTE — ED Notes (Signed)
Pt scheduled for [place] next week for GB.

## 2020-01-07 NOTE — ED Provider Notes (Signed)
[place] EMERGENCY DEPARTMENT Provider Note   CSN: [id number] Arrival date & time: 01/07/20  0557     History Chief Complaint  Patient presents with  . Abdominal Pain    Misty Rodriguez is a 44 y.o. female.  HPI     This 44 year old with a history of PCOS, anxiety who presents with abdominal pain, nausea, vomiting.  Patient reports that she has had several weeks of intermittent abdominal pain that has worsened over the last 24 to 48 hours.  She states that last night she did not sleep because of sharp pains in her abdomen.  They are not necessarily associated with food.  Currently the pain is 4 out of 10.  She did not take anything for her pain.  She reports nonbilious, nonbloody emesis.  She states that she has not had a bowel movement in 3 days.  She reports that she has been worked up for her ongoing symptoms but "no one can find the answer."  She believes this is related to her gallbladder. She reports that she had an aortic ultrasound that was normal.  She recently changed her primary care physician to [facility] primary care.  I reviewed her chart.  It appears that she has had multiple recent visits for generalized fatigue.  She was worked up for chronic Covid but had negative Covid antibodies and never had a positive Covid test.  She was ANA positive but this is nonspecific.  She also had a negative aortic ultrasound.  Otherwise her lab testing was fairly unremarkable.  Past Medical History:  Diagnosis Date  . PCOS (polycystic ovarian syndrome)   . Restless legs syndrome     Patient Active Problem List   Diagnosis Date Noted  . Chest tightness 12/23/2019  . SOB (shortness of breath) 12/23/2019  . Anxiety 12/23/2019  . Hot flashes 12/23/2019  . PCOS (polycystic ovarian syndrome) 12/23/2019  . Palpitations 12/23/2019  . History of anemia 12/23/2019  . Tonsillar hypertrophy 05/16/2015  . Acute pharyngitis 12/22/2014  . Allergic reaction 12/22/2014  . Plantar  fasciitis of left foot 11/08/2014  . OSA (obstructive sleep apnea) 06/06/2014  . RLS (restless legs syndrome) 06/06/2014  . 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 Metatarsal deformity 11/23/2013  . Other hammer toe (acquired) 11/23/2013  . Equinus deformity of foot, acquired 11/23/2013  . Severe obesity (BMI >= 40) ([place]) 11/10/2013  . EPIGASTRIC PAIN 09/24/2009  . OSTEOARTHRITIS 08/10/2009  . KNEE PAIN, RIGHT 08/10/2009    Past Surgical History:  Procedure Laterality Date  . ANKLE SURGERY    . DENTAL SURGERY    . PLANTAR FASCIECTOMY Left 07/2016     OB History   No obstetric history on file.     Family History  Problem Relation Age of Onset  . Aneurysm Father   . Sleep apnea Father   . Stroke Maternal Grandmother   . Hypertension Maternal Grandmother   . Heart disease Maternal Grandfather   . Diabetes Paternal Grandfather   . Aneurysm Paternal Grandfather     Social History   Tobacco Use  . Smoking status: Never Smoker  . Smokeless tobacco: Never Used  Vaping Use  . Vaping Use: Never used  Substance Use Topics  . Alcohol use: Yes    Alcohol/week: 0.0 standard drinks    Comment: Socially only  . Drug use: Yes    Frequency: 7.0 times per week    Types: Marijuana    Comment: smokes marijuana nightly  Home Medications Prior to Admission medications   Medication Sig Start Date End Date Taking? Authorizing Provider  omeprazole (PRILOSEC) 20 MG capsule Take 20 mg by mouth daily. 12/27/19   [provider]    Allergies    Amoxicillin-pot clavulanate and Clindamycin/lincomycin  Review of Systems   Review of Systems  Constitutional: Positive for fatigue. Negative for fever.  Respiratory: Negative for shortness of breath.   Cardiovascular: Negative for chest pain.  Gastrointestinal: Positive for abdominal pain, constipation, nausea and vomiting. Negative for blood in stool and diarrhea.  Genitourinary: Negative for dysuria.    Neurological: Negative for headaches.  All other systems reviewed and are negative.   Physical Exam Updated Vital Signs BP (!) 144/96   Pulse (!) 103   Temp 98.1 F (36.7 C) (Oral)   Resp 16   Ht 1.803 m (5\' 11" )   Wt 113.4 kg   SpO2 99%   BMI 34.87 kg/m   Physical Exam Vitals and nursing note reviewed.  Constitutional:      Appearance: She is well-developed. She is obese. She is not ill-appearing.  HENT:     Head: Normocephalic and atraumatic.  Eyes:     Pupils: Pupils are equal, round, and reactive to light.  Cardiovascular:     Rate and Rhythm: Normal rate and regular rhythm.     Heart sounds: Normal heart sounds.  Pulmonary:     Effort: Pulmonary effort is normal. No respiratory distress.     Breath sounds: No wheezing.  Abdominal:     General: Bowel sounds are normal.     Palpations: Abdomen is soft.     Tenderness: There is no abdominal tenderness. There is no guarding or rebound.  Musculoskeletal:     Cervical back: Neck supple.  Skin:    General: Skin is warm and dry.  Neurological:     Mental Status: She is alert and oriented to person, place, and time.  Psychiatric:        Mood and Affect: Mood normal.     ED Results / Procedures / Treatments   Labs (all labs ordered are listed, but only abnormal results are displayed) Labs Reviewed  URINALYSIS, ROUTINE W REFLEX MICROSCOPIC - Abnormal; Notable for the following components:      Result Value   Hgb urine dipstick SMALL (*)    Ketones, ur >80 (*)    All other components within normal limits  URINALYSIS, MICROSCOPIC (REFLEX) - Abnormal; Notable for the following components:   Bacteria, UA FEW (*)    All other components within normal limits  PREGNANCY, URINE  CBC WITH DIFFERENTIAL/PLATELET  COMPREHENSIVE METABOLIC PANEL  LIPASE, BLOOD    EKG None  Radiology No results found.  Procedures Procedures (including critical care time)  Medications Ordered in ED Medications  sodium chloride  0.9 % bolus 1,000 mL (1,000 mLs Intravenous New Bag/Given 01/07/20 0650)  ondansetron (ZOFRAN) injection 4 mg (4 mg Intravenous Given 01/07/20 01/09/20)    ED Course  I have reviewed the triage vital signs and the nursing notes.  Pertinent labs & imaging results that were available during my care of the patient were reviewed by me and considered in my medical decision making (see chart for details).    MDM Rules/Calculators/A&P                           Patient presents with abdominal pain.  Reports ongoing symptoms with acute worsening last night.  She is overall nontoxic and vital signs are reassuring.  Abdominal exam is fairly benign without objective tenderness or signs of peritonitis.  Considerations include but not limited to gastritis, gastroenteritis, cholecystitis, pancreatitis.  I have extensively reviewed her chart.  Multiple recent visits for ongoing fatigue and vague symptoms.  She is tested negative for Covid and Covid antibodies.  She has had a negative aortic ultrasound.  Will obtain lab work.  Patient given fluids and nausea medication.  Prior to signout, urinalysis noted to have ketones suggestive of dehydration.  Otherwise lab work is pending.  Disposition pending lab work.  Final Clinical Impression(s) / ED Diagnoses Final diagnoses:  Generalized abdominal pain    Rx / DC Orders ED Discharge Orders    None       Shon Baton, MD 01/07/20 [phone]

## 2020-01-07 NOTE — ED Triage Notes (Signed)
Pt with abd pain x several weeks with worsening last night and n/v. Pt had positive ANA test with primary PMD this week.

## 2020-01-07 NOTE — Discharge Instructions (Signed)
Thank you for seeing [place] today! Increase your omeprazole to 2 capsules per day and follow up closely with your doctor.

## 2020-02-03 ENCOUNTER — Ambulatory Visit: Payer: Managed Care, Other (non HMO) | Admitting: Family Medicine

## 2021-03-04 NOTE — Progress Notes (Signed)
Referring-Bobby Witten MD Reason for referral-history of neurocardiogenic syncope, orthostatic hypotension, palpitations and chronic fatigue  HPI: 45 year old female for evaluation of orthostatic hypotension, history of neurocardiogenic syncope, palpitations and chronic fatigue at request of Selina Cooley, MD. Previously followed by Dr. Vernona Rieger in [facility].  Previously felt to have ejection fraction 45 to 50% which improved.  Laboratories August 2021 showed normal cortisol level at 11.2, normal ACTH level at 34, normal TSH, normal free T4.  Tilt table performed August 2021 in [facility] interpreted as positive consistent with neurocardiogenic syncope.  Heart rate decreased prior to decrease in blood pressure and pacemaker felt possibly indicated.  CTA September 2021 showed no pulmonary embolus.  Echocardiogram May 2022 showed normal LV function, mild left ventricular hypertrophy, mild right ventricular enlargement, mild right atrial enlargement.  Based on previous notes she declined metoprolol and Florinef in the past.  Patient states that she developed a virus in May 2021.  This was associated with severe fatigue, dizziness with standing and dyspnea.  She states she was essentially unable to perform any activities for 4 months.  She also noticed that her heart rate would increase when she stood up.  This has slowly improved over time.  She now has no orthostatic symptoms and there is no history of syncope in the recent past.  She has dyspnea on exertion which has improved since May 2021.  There is no orthopnea, PND, pedal edema, chest pain.  Cardiology now asked to evaluate.  Current Outpatient Medications  Medication Sig Dispense Refill   omeprazole (PRILOSEC) 20 MG capsule Take 20 mg by mouth daily. (Patient not taking: Reported on 03/13/2021)     ondansetron (ZOFRAN ODT) 4 MG disintegrating tablet Take 1 tablet (4 mg total) by mouth every 8 (eight) hours as needed for nausea or vomiting.  (Patient not taking: Reported on 03/13/2021) 20 tablet 0   temazepam (RESTORIL) 30 MG capsule Take 30 mg by mouth at bedtime as needed.     No current facility-administered medications for this visit.    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  Lives with boyfriend in a one story home.  No children.     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    Social  Determinants of Health   Financial Resource Strain: Not on file  Food Insecurity: Not on file  Transportation Needs: Not on file  Physical Activity: Not on file  Stress: Not on file  Social Connections: Not on file  Intimate Partner Violence: Not on file    Family History  Problem Relation Age of Onset   Aneurysm Father    Sleep apnea Father    Stroke Maternal Grandmother    Hypertension Maternal Grandmother    Heart disease Maternal Grandfather    Diabetes Paternal Grandfather     Aneurysm Paternal Grandfather     ROS: no fevers or chills, productive cough, hemoptysis, dysphasia, odynophagia, melena, hematochezia, dysuria, hematuria, rash, seizure activity, orthopnea, PND, pedal edema, claudication. Remaining systems are negative.  Physical Exam:   Blood pressure 132/88, pulse 83, height 5\' 11"  (1.803 m), weight 294 lb 1.9 oz (133.4 kg), SpO2 99 %.  General:  Well developed/morbidly obese in NAD Skin warm/dry Patient not depressed No peripheral clubbing Back-normal HEENT-normal/normal eyelids Neck supple/normal carotid upstroke bilaterally; no bruits; no JVD; no thyromegaly chest - CTA/ normal expansion CV - RRR/normal S1 and S2; no murmurs, rubs or gallops;  PMI nondisplaced Abdomen -NT/ND, no HSM, no mass, + bowel sounds, no bruit 2+ femoral pulses, no bruits Ext-no edema, chords, 2+ DP Neuro-grossly nonfocal  ECG -sinus rhythm at a rate of 83, right axis deviation.  Personally reviewed  A/P  1 orthostatic hypotension-she is now not having symptoms of orthostasis.  She questions whether she may have had a viral syndrome which precipitated this in May 2021.  Symptoms are slowly improving.  We discussed the importance of continuing hydration and increased salt intake.  2 history of neurocardiogenic syncope-patient only had syncope on her tilt table.  She otherwise has not had syncope.  We will follow.  3 palpitations-she has an elevated heart rate with standing in the 90-110 range but this has improved over time.  We will continue to follow.  For left trickle hypertrophy-noted on previous echocardiogram by report.  She may require repeat echocardiogram in the future.  June 2021, MD

## 2021-03-13 ENCOUNTER — Other Ambulatory Visit: Payer: Self-pay

## 2021-03-13 ENCOUNTER — Ambulatory Visit: Payer: Managed Care, Other (non HMO) | Admitting: Cardiology

## 2021-03-13 ENCOUNTER — Encounter: Payer: Self-pay | Admitting: Cardiology

## 2021-03-13 VITALS — BP 132/88 | HR 83 | Ht 71.0 in | Wt 294.1 lb

## 2021-03-13 DIAGNOSIS — R002 Palpitations: Secondary | ICD-10-CM | POA: Diagnosis not present

## 2021-03-13 DIAGNOSIS — R55 Syncope and collapse: Secondary | ICD-10-CM

## 2021-03-13 DIAGNOSIS — R0602 Shortness of breath: Secondary | ICD-10-CM | POA: Diagnosis not present

## 2021-03-13 NOTE — Patient Instructions (Signed)

## 2021-08-29 NOTE — Progress Notes (Signed)
HPI: Follow-up neurocardiogenic syncope, orthostasis, palpitations and chronic fatigue. Previously followed by Dr. Abran Richard in [facility].  Previously felt to have ejection fraction 45 to 50% which improved.  Laboratories August 2021 showed normal cortisol level at 11.2, normal ACTH level at 34, normal TSH, normal free T4.  Tilt table performed August 2021 in [facility] interpreted as positive consistent with neurocardiogenic syncope.  Heart rate decreased prior to decrease in blood pressure and pacemaker felt possibly indicated.  CTA September 2021 showed no pulmonary embolus.  Echocardiogram May 2022 showed normal LV function, mild left ventricular hypertrophy, mild right ventricular enlargement, mild right atrial enlargement.  Based on previous notes she declined metoprolol and Florinef in the past.  Patient states that she developed a virus in May 2021.  This was associated with severe fatigue, dizziness with standing and dyspnea.  Since last seen she is symptomatically improved.  She occasionally feels dizzy with standing for prolonged periods of time but denies orthostatic symptoms.  No chest pain.  She has some dyspnea on exertion that she attributes to deconditioning/chronic fatigue syndrome.  Current Outpatient Medications  Medication Sig Dispense Refill   omeprazole (PRILOSEC) 20 MG capsule Take 20 mg by mouth daily. (Patient not taking: Reported on 09/11/2021)     ondansetron (ZOFRAN ODT) 4 MG disintegrating tablet Take 1 tablet (4 mg total) by mouth every 8 (eight) hours as needed for nausea or vomiting. (Patient not taking: Reported on 09/11/2021) 20 tablet 0   temazepam (RESTORIL) 30 MG capsule Take 30 mg by mouth at bedtime as needed. (Patient not taking: Reported on 09/11/2021)     No current facility-administered medications for this visit.     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  Lives with boyfriend in a one story home.  No children.     Works as a Corporate treasurer.  Education: college.    Social Determinants of Health   Financial Resource Strain: Not on file  Food Insecurity: Not on file  Transportation Needs: Not on file  Physical Activity: Not on file  Stress: Not on file  Social Connections: Not on file  Intimate Partner Violence: Not on file    Family History  Problem Relation Age of Onset   Aneurysm Father    Sleep apnea Father    Stroke Maternal Grandmother    Hypertension Maternal Grandmother  Heart disease Maternal Grandfather    Diabetes Paternal Grandfather    Aneurysm Paternal Grandfather     ROS: Fatigue but no fevers or chills, productive cough, hemoptysis, dysphasia, odynophagia, melena, hematochezia, dysuria, hematuria, rash, seizure activity, orthopnea, PND, pedal edema, claudication. Remaining systems are negative.  Physical Exam: Well-developed obese in no acute distress.  Skin is warm and dry.  HEENT is normal.  Neck is supple.  Chest is clear to auscultation with normal expansion.  Cardiovascular  exam is regular rate and rhythm.  Abdominal exam nontender or distended. No masses palpated. Extremities show no edema. neuro grossly intact   A/P  1 orthostatic hypotension-symptoms have improved since she had her viral syndrome in May 2021.  Continue to stress hydration and increase sodium intake.  2 history of neurocardiogenic syncope-patient had syncope only at the time of her tilt table.  No recurrences.  Continue to follow.  3 palpitations-no recent symptoms  4 history of left ventricular hypertrophy, mild right atrial and right ventricular enlargement on prior echocardiogram-we will likely plan to repeat echocardiogram when she returns in 1 year.  Kirk Ruths, MD

## 2021-09-11 ENCOUNTER — Ambulatory Visit: Payer: Managed Care, Other (non HMO) | Admitting: Cardiology

## 2021-09-11 ENCOUNTER — Other Ambulatory Visit: Payer: Self-pay

## 2021-09-11 ENCOUNTER — Encounter: Payer: Self-pay | Admitting: Cardiology

## 2021-09-11 VITALS — BP 134/86 | HR 85 | Ht 71.5 in | Wt 306.0 lb

## 2021-09-11 DIAGNOSIS — I951 Orthostatic hypotension: Secondary | ICD-10-CM | POA: Diagnosis not present

## 2021-09-11 DIAGNOSIS — R002 Palpitations: Secondary | ICD-10-CM | POA: Diagnosis not present

## 2021-09-11 DIAGNOSIS — R55 Syncope and collapse: Secondary | ICD-10-CM | POA: Diagnosis not present

## 2021-09-11 NOTE — Patient Instructions (Signed)

## 2022-02-06 IMAGING — US US ABDOMEN LIMITED
1 series · 14 of 25 positions shown · non-contrast
Comparison: None.

CLINICAL DATA: 43-year-old female with a history of abdominal pain

EXAM:
ULTRASOUND ABDOMEN LIMITED RIGHT UPPER QUADRANT

[Series 1: us abdomen limited · 14 of 42 slices shown]
[im 1/42]
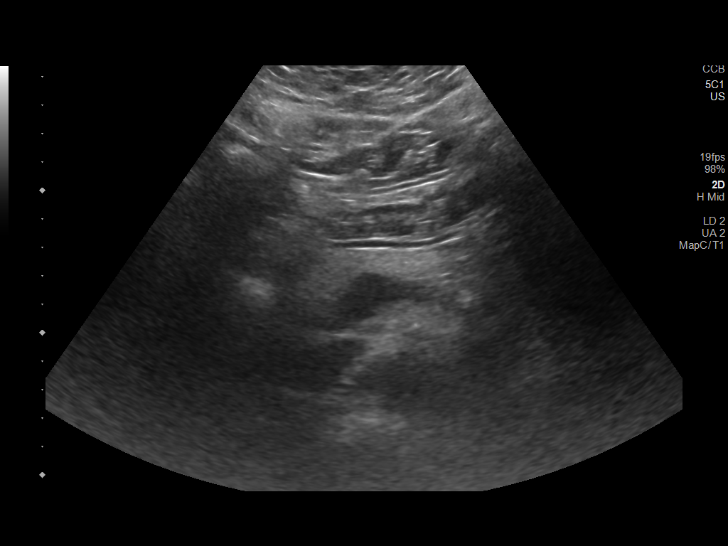
[im 4/42]
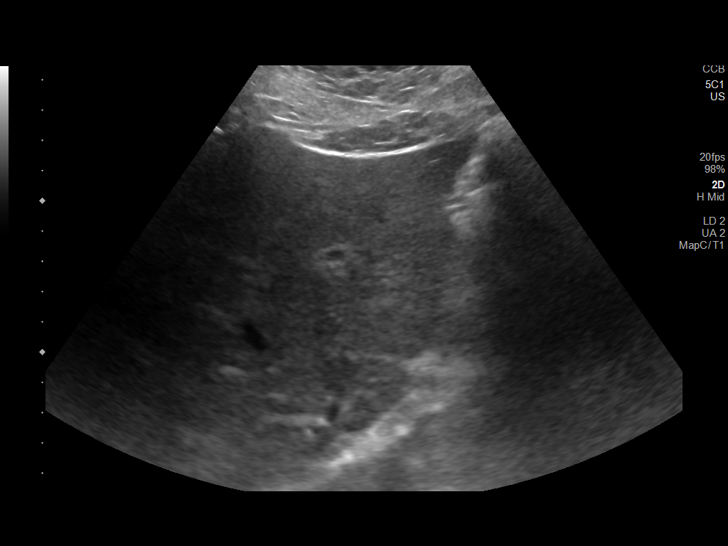
[im 7/42]
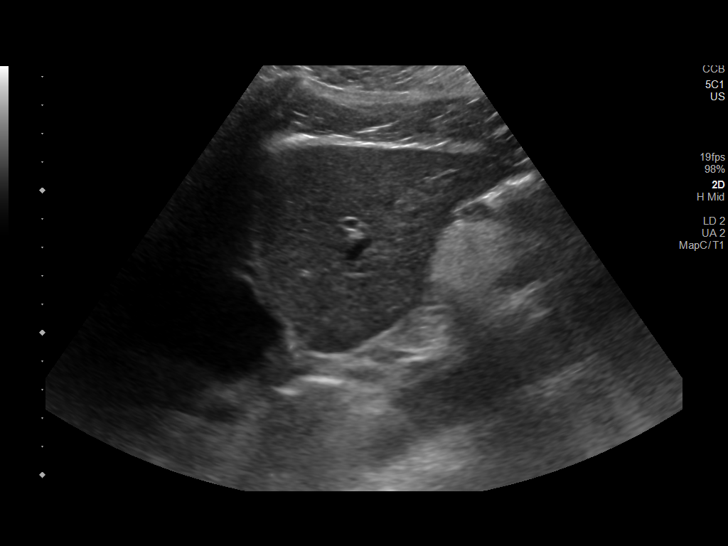
[im 11/42]
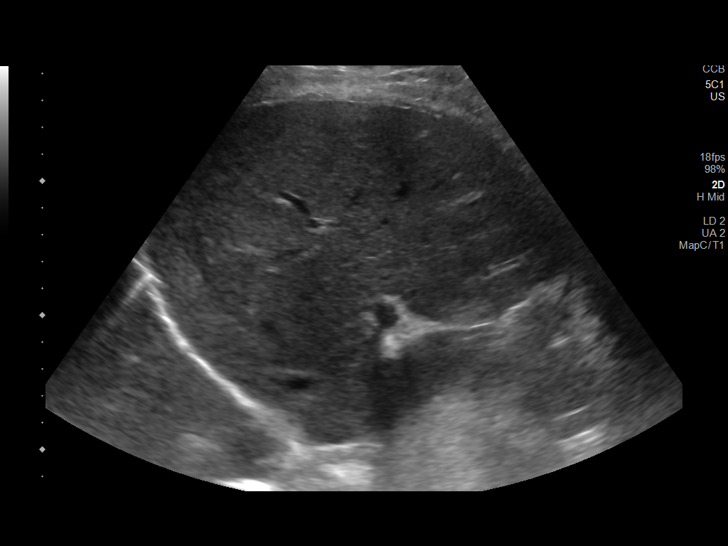
[im 14/42]
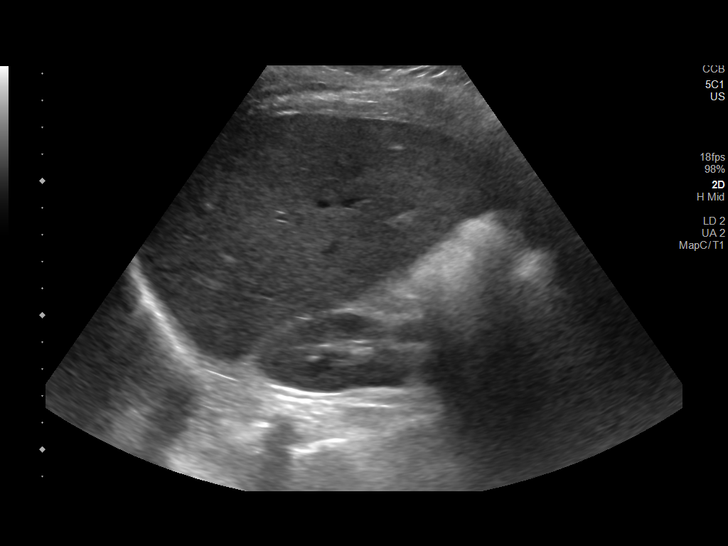
[im 16/42]
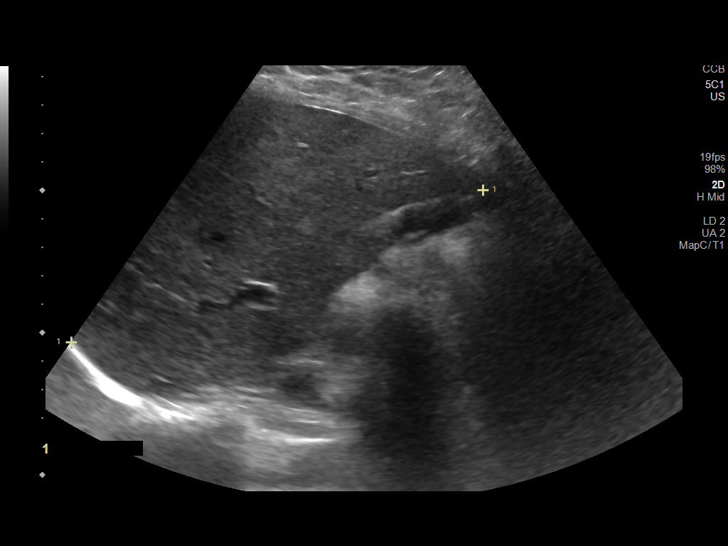
[im 19/42]
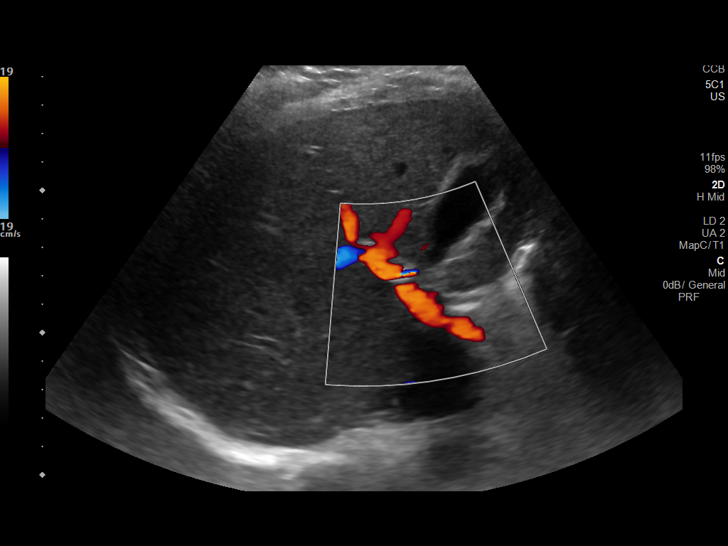
[im 23/42]
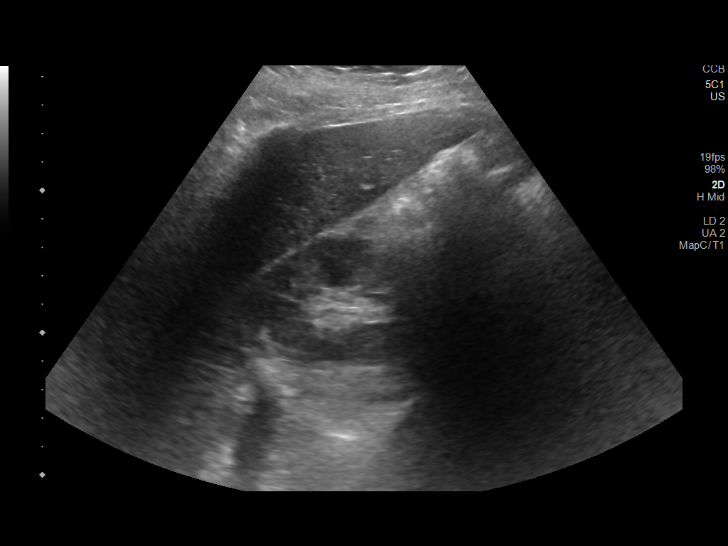
[im 26/42]
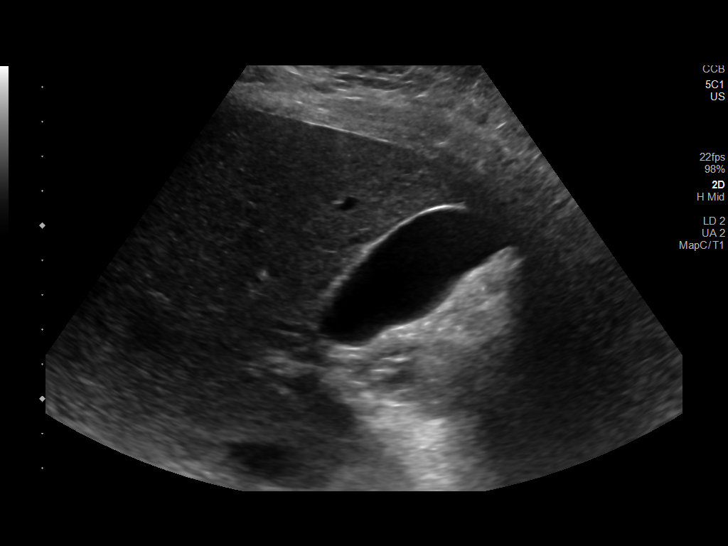
[im 28/42]
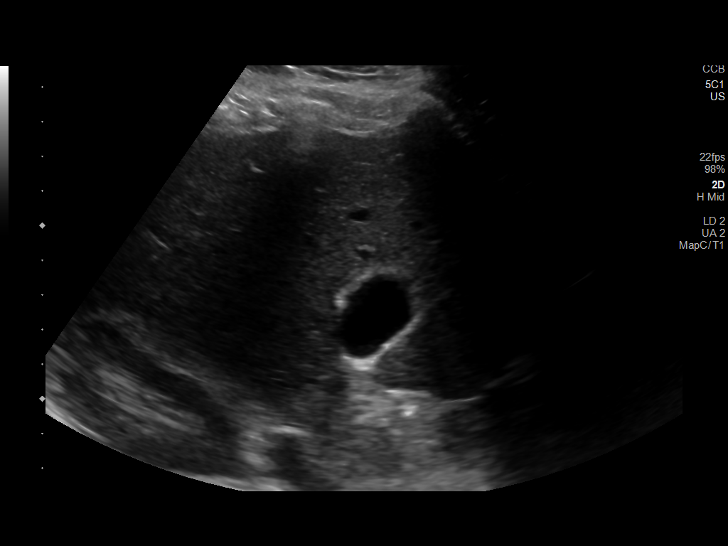
[im 31/42]
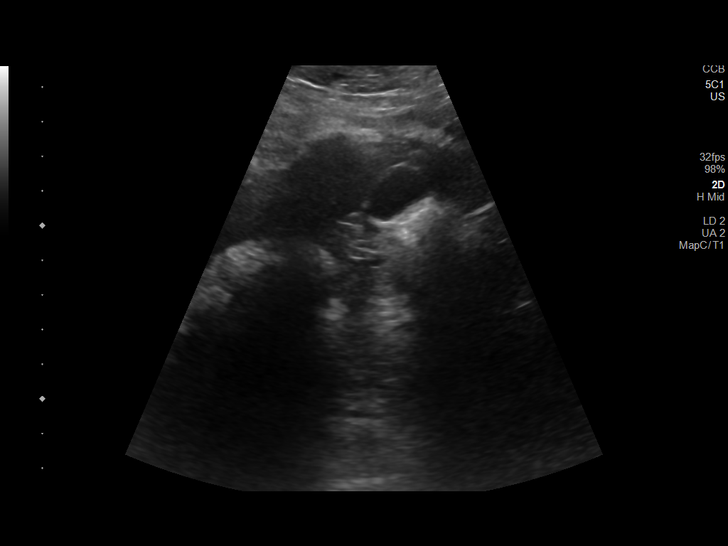
[im 35/42]
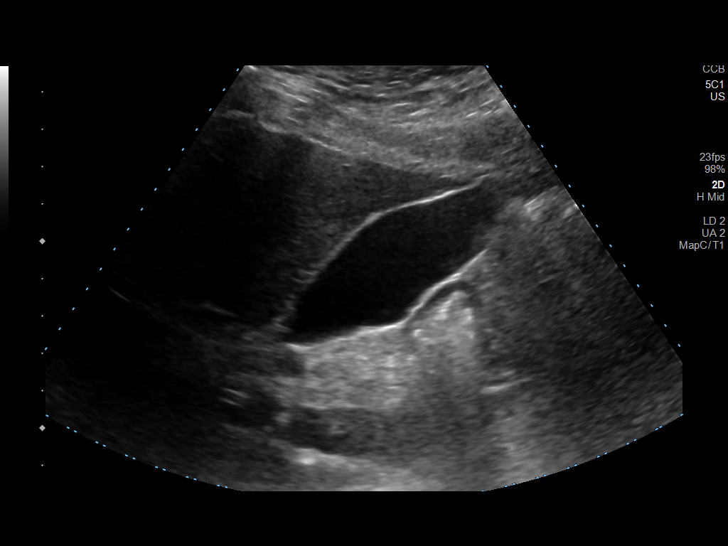
[im 38/42]
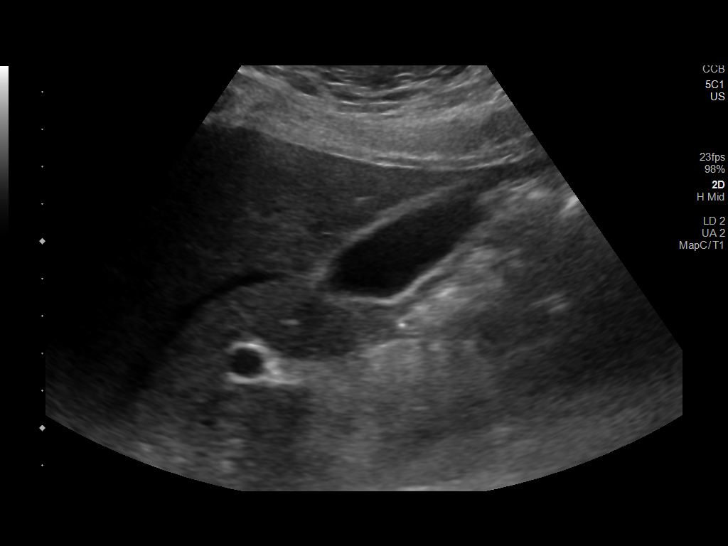
[im 42/42]
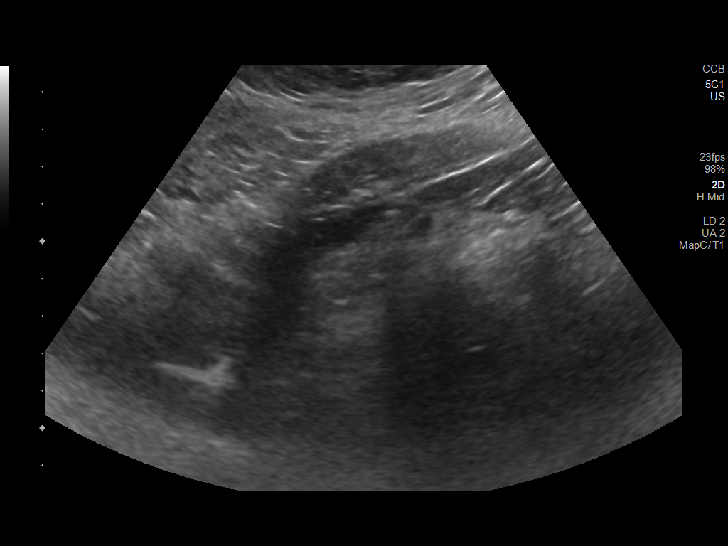

[14 of 25 positions shown; findings below may reference images not displayed]

FINDINGS: Gallbladder:

No gallstones or wall thickening visualized. No sonographic Murphy
sign noted by sonographer.

Common bile duct:

Diameter: 4 mm-5 mm

Liver:

No focal lesion identified. Within normal limits in parenchymal
echogenicity. Portal vein is patent on color Doppler imaging with
normal direction of blood flow towards the liver.

Other: None.
IMPRESSION: Unremarkable sonographic survey of the right upper quadrant.

## 2023-04-02 NOTE — Progress Notes (Deleted)
HPI: Follow-up neurocardiogenic syncope, orthostasis, palpitations and chronic fatigue. Previously followed by Dr. Vernona Rieger in [facility].  Previously felt to have ejection fraction 45 to 50% which improved.  Laboratories August 2021 showed normal cortisol level at 11.2, normal ACTH level at 34, normal TSH, normal free T4.  Tilt table performed August 2021 in [facility] interpreted as positive consistent with neurocardiogenic syncope.  Heart rate decreased prior to decrease in blood pressure and pacemaker felt possibly indicated.  CTA September 2021 showed no pulmonary embolus.  Echocardiogram May 2022 showed normal LV function, mild left ventricular hypertrophy, mild right ventricular enlargement, mild right atrial enlargement.  Based on previous notes she declined metoprolol and Florinef in the past.  Patient states that she developed a virus in May 2021.  This was associated with severe fatigue, dizziness with standing and dyspnea.  Since last seen   Current Outpatient Medications  Medication Sig Dispense Refill   omeprazole (PRILOSEC) 20 MG capsule Take 20 mg by mouth daily. (Patient not taking: Reported on 09/11/2021)     ondansetron (ZOFRAN ODT) 4 MG disintegrating tablet Take 1 tablet (4 mg total) by mouth every 8 (eight) hours as needed for nausea or vomiting. (Patient not taking: Reported on 09/11/2021) 20 tablet 0   temazepam (RESTORIL) 30 MG capsule Take 30 mg by mouth at bedtime as needed. (Patient not taking: Reported on 09/11/2021)     No current facility-administered medications for this visit.     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  Lives with boyfriend in a one story home.  No children.     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    Social Determinants of Health   Financial Resource Strain: Not on file  Food Insecurity: Not on file  Transportation Needs: Not on file  Physical Activity: Not on file  Stress: Not on file  Social Connections: Not on file  Intimate Partner Violence: Not on file    Family History  Problem Relation Age of Onset   Aneurysm Father    Sleep apnea Father    Stroke Maternal Grandmother    Hypertension Maternal Grandmother    Heart disease Maternal Grandfather    Diabetes Paternal Grandfather    Aneurysm Paternal Grandfather     ROS: no fevers or chills, productive cough, hemoptysis, dysphasia, odynophagia, melena, hematochezia, dysuria,  hematuria, rash, seizure activity, orthopnea, PND, pedal edema, claudication. Remaining systems are negative.  Physical Exam: Well-developed well-nourished in no acute distress.  Skin is warm and dry.  HEENT is normal.  Neck is supple.  Chest is clear to auscultation with normal expansion.  Cardiovascular exam is regular rate and rhythm.  Abdominal exam nontender or distended. No masses palpated. Extremities show no edema. neuro grossly intact  ECG- personally reviewed  A/P  1 orthostasis-patient is doing well from a symptomatic  standpoint.  We again discussed the importance of maintaining hydration and increase sodium intake.  2 history of neurocardiogenic syncope-patient has had no recurrences and only had prior syncopal episode at the time of her tilt table.  Will continue to follow.  3 history of left ventricular hypertrophy/mild right atrial enlargement/right ventricular enlargement-we will plan to repeat echocardiogram to reassess.  4 palpitations-patient denies recent symptoms.  Will follow.  Olga Millers, MD

## 2023-04-08 ENCOUNTER — Ambulatory Visit: Payer: Managed Care, Other (non HMO) | Admitting: Cardiology
